# Patient Record
Sex: Male | Born: 1960 | Race: White | Hispanic: No | Marital: Married | Smoking: Current some day smoker
Health system: Southern US, Community
[De-identification: ages and names within clinical notes are randomized; demographics above are authoritative.]

## PROBLEM LIST (undated history)

## (undated) DIAGNOSIS — B182 Chronic viral hepatitis C: Secondary | ICD-10-CM

## (undated) DIAGNOSIS — M1990 Unspecified osteoarthritis, unspecified site: Secondary | ICD-10-CM

## (undated) DIAGNOSIS — K7460 Unspecified cirrhosis of liver: Secondary | ICD-10-CM

## (undated) HISTORY — PX: WRIST SURGERY: SHX841

## (undated) HISTORY — DX: Chronic viral hepatitis C: B18.2

## (undated) HISTORY — DX: Unspecified cirrhosis of liver: K74.60

## (undated) HISTORY — DX: Unspecified osteoarthritis, unspecified site: M19.90

## (undated) HISTORY — PX: FRACTURE SURGERY: SHX138

## (undated) HISTORY — PX: KNEE SURGERY: SHX244

---

## 1998-12-20 ENCOUNTER — Emergency Department (HOSPITAL_COMMUNITY): Admission: EM | Admit: 1998-12-20 | Discharge: 1998-12-20 | Payer: Self-pay

## 2005-01-03 ENCOUNTER — Ambulatory Visit: Payer: Self-pay | Admitting: Family Medicine

## 2012-03-03 ENCOUNTER — Emergency Department: Payer: Self-pay | Admitting: Emergency Medicine

## 2012-03-03 LAB — URINALYSIS, COMPLETE
Bacteria: NONE SEEN
Bilirubin,UR: NEGATIVE
Glucose,UR: NEGATIVE mg/dL (ref 0–75)
Ketone: NEGATIVE
Leukocyte Esterase: NEGATIVE
Nitrite: NEGATIVE
Ph: 6 (ref 4.5–8.0)
Protein: NEGATIVE
RBC,UR: <1 /HPF (ref 0–5)
Specific Gravity: 1.002 (ref 1.003–1.030)
Squamous Epithelial: NONE SEEN
WBC UR: 1 /HPF (ref 0–5)

## 2012-03-03 LAB — CBC WITH DIFFERENTIAL/PLATELET
Bands: 15 %
Eosinophil: 2 %
HCT: 45.4 % (ref 40.0–52.0)
HGB: 16.2 g/dL (ref 13.0–18.0)
Lymphocytes: 21 %
MCH: 35.7 pg — ABNORMAL HIGH (ref 26.0–34.0)
MCHC: 35.7 g/dL (ref 32.0–36.0)
MCV: 100 fL (ref 80–100)
Metamyelocyte: 2 %
Monocytes: 5 %
Other Cells Blood: 3
Platelet: 35 10*3/uL — ABNORMAL LOW (ref 150–440)
RBC: 4.53 10*6/uL (ref 4.40–5.90)
RDW: 11.9 % (ref 11.5–14.5)
Segmented Neutrophils: 34 %
Variant Lymphocyte - H1-Rlymph: 18 %
WBC: 5.5 10*3/uL (ref 3.8–10.6)

## 2012-03-03 LAB — BASIC METABOLIC PANEL
Anion Gap: 6 — ABNORMAL LOW (ref 7–16)
BUN: 11 mg/dL (ref 7–18)
Calcium, Total: 8.6 mg/dL (ref 8.5–10.1)
Chloride: 94 mmol/L — ABNORMAL LOW (ref 98–107)
Co2: 30 mmol/L (ref 21–32)
Creatinine: 0.91 mg/dL (ref 0.60–1.30)
EGFR (African American): >60
EGFR (Non-African Amer.): >60
Glucose: 113 mg/dL — ABNORMAL HIGH (ref 65–99)
Osmolality: 261 (ref 275–301)
Potassium: 3.4 mmol/L — ABNORMAL LOW (ref 3.5–5.1)
Sodium: 130 mmol/L — ABNORMAL LOW (ref 136–145)

## 2016-01-01 ENCOUNTER — Ambulatory Visit: Payer: PRIVATE HEALTH INSURANCE | Admitting: Orthopaedic Surgery

## 2016-01-22 ENCOUNTER — Ambulatory Visit (INDEPENDENT_AMBULATORY_CARE_PROVIDER_SITE_OTHER): Payer: PRIVATE HEALTH INSURANCE

## 2016-01-22 ENCOUNTER — Ambulatory Visit (INDEPENDENT_AMBULATORY_CARE_PROVIDER_SITE_OTHER): Payer: PRIVATE HEALTH INSURANCE | Admitting: Orthopaedic Surgery

## 2016-01-22 ENCOUNTER — Encounter: Payer: Self-pay | Admitting: Orthopaedic Surgery

## 2016-01-22 VITALS — BP 110/77 | HR 71 | Temp 96.8°F | Ht 74.0 in | Wt 234.5 lb

## 2016-01-22 DIAGNOSIS — M25562 Pain in left knee: Secondary | ICD-10-CM

## 2016-01-22 MED ORDER — NAPROXEN 500 MG PO TABS: 500.0000 mg | ORAL_TABLET | Freq: Two times a day (BID) | ORAL | Status: DC

## 2016-01-22 MED ORDER — HYDROCODONE-ACETAMINOPHEN 5-325 MG PO TABS: 1.0000 | ORAL_TABLET | ORAL | Status: DC | PRN

## 2016-01-22 NOTE — Progress Notes (Signed)
Subjective:  My left knee hurts    Patient ID: Alexander Wilkins, male    DOB: [birth year], 55 y.o.   MRN: [id number]  HPI He has about a two month to six week history of left knee pain.  It got worse, it had swelling and he had slight giving way.  Then it got better so he cancelled an appointment here.  But over the last two weeks it has gotten more tender and more swelling.  He stepped wrong off a back of a truck about a month ago which made it worse.  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  He is active.  The knee is causing him to slow down and is painful.  He is concerned as he had surgery on the right knee in 1974 for medial meniscus tear.  He is concerned his knee is no better.  He has tried Advil, Aspercreme, rest, ice, heat with only slight help.  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   Review of Systems  HENT: Negative for congestion.   Respiratory: Negative for cough and shortness of breath.   Cardiovascular: Negative for chest pain and leg swelling.  Endocrine: Negative for cold intolerance.  Musculoskeletal: Positive for joint swelling, arthralgias and gait problem.  Allergic/Immunologic: Negative for environmental allergies.   History reviewed. No pertinent past medical history.  Past Surgical History  Procedure Laterality Date  . Knee surgery Right   . Wrist surgery Left     No current outpatient prescriptions on file prior to visit.   No current facility-administered medications on file prior to visit.    Social History   Social History  . Marital Status: Divorced    Spouse Name: N/A  . Number of Children: N/A  . Years of Education: N/A   Occupational History  . Not on file.   Social History Main Topics  . Smoking status: Former Games developermoker  . Smokeless tobacco: Never Used  . Alcohol Use: Yes     Comment: occ  . Drug Use: No  . Sexual Activity: Not on file   Other Topics Concern  . Not on file   Social History Narrative   . No narrative on file    BP 110/77 mmHg  Pulse 71  Temp(Src) 96.8 F (36 C)  Ht 6\' 2"  (1.88 m)  Wt 234 lb 8 oz (106.369 kg)  BMI 30.10 kg/m2     Objective:   Physical Exam  Constitutional: He is oriented to person, place, and time. He appears well-developed and well-nourished.  HENT:  Head: Normocephalic and atraumatic.  Eyes: Conjunctivae and EOM are normal. Pupils are equal, round, and reactive to light.  Neck: Normal range of motion. Neck supple.  Cardiovascular: Normal rate, regular rhythm and intact distal pulses.   Pulmonary/Chest: Effort normal.  Abdominal: Soft.  Musculoskeletal: He exhibits tenderness (Left knee has effusion 1+, medial joint line pain, weakly positive McMurray medially, ROM 0 to 110, slight limp to the left, right knee has long medial scar, full motion.  NV intact.).  Neurological: He is alert and oriented to person, place, and time. He has normal reflexes. No cranial nerve deficit. He exhibits normal muscle tone. Coordination normal.  Skin: Skin is warm and dry.  Psychiatric: He has a normal mood and affect. His behavior is normal. Judgment and thought content normal.   X-rays of the left knee were done and reported separately.  Assessment & Plan:   Encounter Diagnosis  Name Primary?  . Left knee pain Yes   I feel he has a medial meniscus tear by history and exam.  He needs to wait six weeks before insurance will cover MRI.  I have given Rx for Naprosyn and pain medicine.    Precautions discussed.  Call if any problem.  Return in one month.  Electronically Signed Darreld McleanWayne Oluwatosin Bracy, MD 6/27/20174:34 PM

## 2016-02-19 ENCOUNTER — Encounter: Payer: Self-pay | Admitting: Orthopaedic Surgery

## 2016-02-19 ENCOUNTER — Ambulatory Visit: Payer: PRIVATE HEALTH INSURANCE | Admitting: Orthopaedic Surgery

## 2016-02-20 ENCOUNTER — Encounter: Payer: Self-pay | Admitting: Orthopaedic Surgery

## 2016-02-20 ENCOUNTER — Ambulatory Visit (INDEPENDENT_AMBULATORY_CARE_PROVIDER_SITE_OTHER): Payer: No Typology Code available for payment source | Admitting: Orthopaedic Surgery

## 2016-02-20 VITALS — BP 121/79 | HR 71 | Temp 97.5°F | Ht 74.0 in | Wt 236.0 lb

## 2016-02-20 DIAGNOSIS — M25562 Pain in left knee: Secondary | ICD-10-CM | POA: Diagnosis not present

## 2016-02-20 MED ORDER — DOXYCYCLINE HYCLATE 50 MG PO CAPS: ORAL_CAPSULE | ORAL | 0 refills | Status: DC

## 2016-02-20 NOTE — Patient Instructions (Signed)
Set up MRI of the left knee.

## 2016-02-20 NOTE — Progress Notes (Signed)
Patient Alexander Wilkins, male DOB:March 18, 1961, 55 y.o. WUJ:[id number]  Chief Complaint  Patient presents with  . Follow-up    Left knee    HPI  Alexander Wilkins is a 55 y.o. male who has continued left knee pain. He has swelling, popping and giving way of the knee.  He is unable to sail on his sail boat secondary to the knee instability and pain.  He has no new trauma.  He has done the exercises given and taken the medicine. HPI  Body mass index is 30.3 kg/m.  ROS  Review of Systems  HENT: Negative for congestion.   Respiratory: Negative for cough and shortness of breath.   Cardiovascular: Negative for chest pain and leg swelling.  Endocrine: Negative for cold intolerance.  Musculoskeletal: Positive for arthralgias, gait problem and joint swelling.  Allergic/Immunologic: Negative for environmental allergies.    History reviewed. No pertinent past medical history.  Past Surgical History:  Procedure Laterality Date  . KNEE SURGERY Right   . WRIST SURGERY Left     History reviewed. No pertinent family history.  Social History Social History  Substance Use Topics  . Smoking status: Former Games developer  . Smokeless tobacco: Never Used  . Alcohol use Yes     Comment: occ    No Known Allergies  Current Outpatient Prescriptions  Medication Sig Dispense Refill  . HYDROcodone-acetaminophen (NORCO/VICODIN) 5-325 MG tablet Take 1 tablet by mouth every 4 (four) hours as needed for moderate pain (Must last 15 days.Do not take and drive a car or use machinery.). 60 tablet 0  . naproxen (NAPROSYN) 500 MG tablet Take 1 tablet (500 mg total) by mouth 2 (two) times daily with a meal. 60 tablet 5  . doxycycline (VIBRAMYCIN) 50 MG capsule Two tablets by mouth twice a day 40 capsule 0   No current facility-administered medications for this visit.      Physical Exam  Blood pressure 121/79, pulse 71, temperature 97.5 F (36.4 C), height  (1.88 m), weight 236 lb (107  kg).  Constitutional: overall normal hygiene, normal nutrition, well developed, normal grooming, normal body habitus. Assistive device:none  Musculoskeletal: gait and station Limp left, muscle tone and strength are normal, no tremors or atrophy is present.  .  Neurological: coordination overall normal.  Deep tendon reflex/nerve stretch intact.  Sensation normal.  Cranial nerves II-XII intact.   Skin:   normal overall no scars, lesions, ulcers or rashes. No psoriasis.  Psychiatric: Alert and oriented x 3.  Recent memory intact, remote memory unclear.  Normal mood and affect. Well groomed.  Good eye contact.  Cardiovascular: overall no swelling, no varicosities, no edema bilaterally, normal temperatures of the legs and arms, no clubbing, cyanosis and good capillary refill.  Lymphatic: palpation is normal.  The left lower extremity is examined:  Inspection:  Thigh:  Non-tender and no defects  Knee has swelling 1+ effusion.                        Joint tenderness is present                        Patient is tender over the medial joint line  Lower Leg:  Has normal appearance and no tenderness or defects  Ankle:  Non-tender and no defects  Foot:  Non-tender and no defects Range of Motion:  Knee:  Range of motion is: 0-110  Crepitus is  present  Ankle:  Range of motion is normal. Strength and Tone:  The left lower extremity has normal strength and tone. Stability:  Knee:  The knee has positive medial McMurray.  Ankle:  The ankle is stable.    The patient has been educated about the nature of the problem(s) and counseled on treatment options.  The patient appeared to understand what I have discussed and is in agreement with it.  Encounter Diagnosis  Name Primary?  . Left knee pain Yes    PLAN Call if any problems.  Precautions discussed.  Continue current medications.   Return to clinic after MRI of the left knee.   I am concerned about a medial  meniscus tear of the left knee. He has pain, swelling, and giving way.  He has not improved with rest, NSAIDs.  He has positive medial McMurray.  I await the MRI. I have shown patient a model of the knee and explained what I think he has going on in the knee.  Electronically Signed Darreld Mclean, MD 7/26/201710:53 AM

## 2016-02-26 ENCOUNTER — Ambulatory Visit: Payer: PRIVATE HEALTH INSURANCE | Admitting: Orthopaedic Surgery

## 2016-03-05 ENCOUNTER — Ambulatory Visit (HOSPITAL_COMMUNITY)
Admission: RE | Admit: 2016-03-05 | Discharge: 2016-03-05 | Disposition: A | Discharge status: Home / Self Care | Payer: No Typology Code available for payment source | Source: Ambulatory Visit | Attending: Orthopaedic Surgery | Admitting: Orthopaedic Surgery

## 2016-03-05 DIAGNOSIS — M25562 Pain in left knee: Secondary | ICD-10-CM | POA: Insufficient documentation

## 2016-03-05 DIAGNOSIS — M7122 Synovial cyst of popliteal space [Baker], left knee: Secondary | ICD-10-CM | POA: Insufficient documentation

## 2016-03-05 DIAGNOSIS — M11262 Other chondrocalcinosis, left knee: Secondary | ICD-10-CM | POA: Diagnosis not present

## 2016-03-05 DIAGNOSIS — M1712 Unilateral primary osteoarthritis, left knee: Secondary | ICD-10-CM | POA: Insufficient documentation

## 2016-03-05 DIAGNOSIS — X58XXXA Exposure to other specified factors, initial encounter: Secondary | ICD-10-CM | POA: Diagnosis not present

## 2016-03-05 DIAGNOSIS — S83242A Other tear of medial meniscus, current injury, left knee, initial encounter: Secondary | ICD-10-CM | POA: Insufficient documentation

## 2016-03-06 ENCOUNTER — Ambulatory Visit (INDEPENDENT_AMBULATORY_CARE_PROVIDER_SITE_OTHER): Payer: PRIVATE HEALTH INSURANCE | Admitting: Orthopaedic Surgery

## 2016-03-06 ENCOUNTER — Encounter: Payer: Self-pay | Admitting: Orthopaedic Surgery

## 2016-03-06 VITALS — BP 111/74 | HR 74 | Temp 97.9°F | Ht 74.0 in | Wt 234.6 lb

## 2016-03-06 DIAGNOSIS — M25562 Pain in left knee: Secondary | ICD-10-CM

## 2016-03-06 MED ORDER — HYDROCODONE-ACETAMINOPHEN 7.5-325 MG PO TABS: 1.0000 | ORAL_TABLET | ORAL | 0 refills | Status: DC | PRN

## 2016-03-06 NOTE — Progress Notes (Signed)
Patient XL:[id number] Alexander Wilkins, male DOB:1960-10-17, 55 y.o. VOZ:[id number]  Chief Complaint  Patient presents with  . Results    MRI LEFT KNEE    HPI  Alexander Wilkins is a 55 y.o. male who has had left knee pain that was getting worse with giving way, swelling and pain. MRI was done and it shows: IMPRESSION: 1. 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 Associated with meniscal chondrocalcinosis, this suggests CPPD arthropathy. Central loose bodies anteriorly in the joint, possibly imbedded in Hoffa's fat. 2. Mild degenerative free edge tearing of the posterior horn of the medial meniscus. The lateral meniscus appears intact. 3. The cruciate and collateral ligaments are intact. 4. Moderate-sized Baker's cyst.  I went over the results with him.  I told him over time he would be a candidate for a total knee.  I would recommend vicosupplementation for now. I have given Rx for Synvisc for him.  He will contact his insurance and see if it will cover it.  He will then call the office and set up three weekly appointments. HPI  Body mass index is 30.12 kg/m.  ROS  Review of Systems  HENT: Negative for congestion.   Respiratory: Negative for cough and shortness of breath.   Cardiovascular: Negative for chest pain and leg swelling.  Endocrine: Negative for cold intolerance.  Musculoskeletal: Positive for arthralgias, gait problem and joint swelling.  Allergic/Immunologic: Negative for environmental allergies.    History reviewed. No pertinent past medical history.  Past Surgical History:  Procedure Laterality Date  . KNEE SURGERY Right   . WRIST SURGERY Left     History reviewed. No pertinent family history.  Social History Social History  Substance Use Topics  . Smoking status: Former Games developer  . Smokeless tobacco: Never Used  . Alcohol use Yes     Comment: occ    No Known Allergies  Current Outpatient  Prescriptions  Medication Sig Dispense Refill  . doxycycline (VIBRAMYCIN) 50 MG capsule Two tablets by mouth twice a day 40 capsule 0  . naproxen (NAPROSYN) 500 MG tablet Take 1 tablet (500 mg total) by mouth 2 (two) times daily with a meal. 60 tablet 5  . HYDROcodone-acetaminophen (NORCO) 7.5-325 MG tablet Take 1 tablet by mouth every 4 (four) hours as needed for moderate pain (Must last 30 days.  Do not drive or operate machinery while taking this medicine.). 120 tablet 0   No current facility-administered medications for this visit.      Physical Exam  Blood pressure 111/74, pulse 74, temperature 97.9 F (36.6 C), height  (1.88 m), weight 234 lb 9.6 oz (106.4 kg).  Constitutional: overall normal hygiene, normal nutrition, well developed, normal grooming, normal body habitus. Assistive device:none  Musculoskeletal: gait and station Limp left, muscle tone and strength are normal, no tremors or atrophy is present.  .  Neurological: coordination overall normal.  Deep tendon reflex/nerve stretch intact.  Sensation normal.  Cranial nerves II-XII intact.   Skin:   normal overall no scars, lesions, ulcers or rashes. No psoriasis.  Psychiatric: Alert and oriented x 3.  Recent memory intact, remote memory unclear.  Normal mood and affect. Well groomed.  Good eye contact.  Cardiovascular: overall no swelling, no varicosities, no edema bilaterally, normal temperatures of the legs and arms, no clubbing, cyanosis and good capillary refill.  Lymphatic: palpation is normal.  The left lower extremity is examined:  Inspection:  Thigh:  Non-tender and no defects  Knee has  swelling 1+ effusion.                        Joint tenderness is present                        Patient is tender over the medial joint line  Lower Leg:  Has normal appearance and no tenderness or defects  Ankle:  Non-tender and no defects  Foot:  Non-tender and no defects Range of Motion:  Knee:  Range of motion is:  0-105                        Crepitus is  present  Ankle:  Range of motion is normal. Strength and Tone:  The left lower extremity has normal strength and tone. Stability:  Knee:  The knee is stable.  Ankle:  The ankle is stable.    The patient has been educated about the nature of the problem(s) and counseled on treatment options.  The patient appeared to understand what I have discussed and is in agreement with it.  Encounter Diagnosis  Name Primary?  . Left knee pain Yes    PLAN Call if any problems.  Precautions discussed.  Continue current medications.   Return to clinic to get Synvisc and then make appointments.   Electronically Signed Darreld McleanWayne Eldo Umanzor, MD 8/10/20174:10 PM

## 2016-03-10 ENCOUNTER — Telehealth: Payer: Self-pay | Admitting: Orthopaedic Surgery

## 2016-03-11 MED ORDER — DOXYCYCLINE HYCLATE 50 MG PO CAPS: ORAL_CAPSULE | ORAL | 0 refills | Status: DC

## 2016-09-03 ENCOUNTER — Ambulatory Visit (INDEPENDENT_AMBULATORY_CARE_PROVIDER_SITE_OTHER): Payer: Self-pay | Admitting: Orthopaedic Surgery

## 2016-09-03 ENCOUNTER — Encounter: Payer: Self-pay | Admitting: Orthopaedic Surgery

## 2016-09-03 VITALS — BP 131/72 | HR 73 | Temp 97.7°F | Ht 74.0 in | Wt 236.0 lb

## 2016-09-03 DIAGNOSIS — G8929 Other chronic pain: Secondary | ICD-10-CM

## 2016-09-03 DIAGNOSIS — M25562 Pain in left knee: Secondary | ICD-10-CM

## 2016-09-03 MED ORDER — HYDROCODONE-ACETAMINOPHEN 7.5-325 MG PO TABS: 1.0000 | ORAL_TABLET | ORAL | 0 refills | Status: DC | PRN

## 2016-09-03 NOTE — Progress Notes (Signed)
Patient Alexander Wilkins, male DOB:Aug 27, 1960, 56 y.o. [id number]:[id number]  Chief Complaint  Patient presents with  . Follow-up    patient wants to discuss medication    HPI  Alexander Wilkins is a 56 y.o. male who has had pain in the left knee chronically.  I last saw him in August 2017.  We discussed possible Synvisc injections.  He has decided he does not want that.  He takes his Naprosyn.  He has good and bad days.  He has no new trauma.  He has swelling and popping but no giving way.   HPI  Body mass index is 30.3 kg/m.  ROS  Review of Systems  HENT: Negative for congestion.   Respiratory: Negative for cough and shortness of breath.   Cardiovascular: Negative for chest pain and leg swelling.  Endocrine: Negative for cold intolerance.  Musculoskeletal: Positive for arthralgias, gait problem and joint swelling.  Allergic/Immunologic: Negative for environmental allergies.    No past medical history on file.  Past Surgical History:  Procedure Laterality Date  . KNEE SURGERY Right   . WRIST SURGERY Left     No family history on file.  Social History Social History  Substance Use Topics  . Smoking status: Former Games developer  . Smokeless tobacco: Never Used  . Alcohol use Yes     Comment: occ    No Known Allergies  Current Outpatient Prescriptions  Medication Sig Dispense Refill  . doxycycline (VIBRAMYCIN) 50 MG capsule Two tablets by mouth twice a day 40 capsule 0  . HYDROcodone-acetaminophen (NORCO) 7.5-325 MG tablet Take 1 tablet by mouth every 4 (four) hours as needed for moderate pain (Must last 30 days.  Do not drive or operate machinery while taking this medicine.). 120 tablet 0  . naproxen (NAPROSYN) 500 MG tablet Take 1 tablet (500 mg total) by mouth 2 (two) times daily with a meal. 60 tablet 5   No current facility-administered medications for this visit.      Physical Exam  Blood pressure 131/72, pulse 73, temperature 97.7 F (36.5 C), height 6\' 2"   (1.88 m), weight 236 lb (107 kg).  Constitutional: overall normal hygiene, normal nutrition, well developed, normal grooming, normal body habitus. Assistive device:none  Musculoskeletal: gait and station Limp left, muscle tone and strength are normal, no tremors or atrophy is present.  .  Neurological: coordination overall normal.  Deep tendon reflex/nerve stretch intact.  Sensation normal.  Cranial nerves II-XII intact.   Skin:   Normal overall no scars, lesions, ulcers or rashes. No psoriasis.  Psychiatric: Alert and oriented x 3.  Recent memory intact, remote memory unclear.  Normal mood and affect. Well groomed.  Good eye contact.  Cardiovascular: overall no swelling, no varicosities, no edema bilaterally, normal temperatures of the legs and arms, no clubbing, cyanosis and good capillary refill.  Lymphatic: palpation is normal.  The left lower extremity is examined:  Inspection:  Thigh:  Non-tender and no defects  Knee has swelling 1+ effusion.                        Joint tenderness is present                        Patient is tender over the medial joint line  Lower Leg:  Has normal appearance and no tenderness or defects  Ankle:  Non-tender and no defects  Foot:  Non-tender and no defects Range of Motion:  Knee:  Range of motion is: 0-110                        Crepitus is  present  Ankle:  Range of motion is normal. Strength and Tone:  The left lower extremity has normal strength and tone. Stability:  Knee:  The knee is stable.  Ankle:  The ankle is stable.    The patient has been educated about the nature of the problem(s) and counseled on treatment options.  The patient appeared to understand what I have discussed and is in agreement with it.  Encounter Diagnosis  Name Primary?  . Chronic pain of left knee Yes    PLAN Call if any problems.  Precautions discussed.  Continue current medications.   Return to clinic PRN   [facility] [place] [facility] [place] web site prior to prescribing narcotic medicine for this patient.  Electronically Signed Alexander McleanWayne Felcia Huebert, MD 2/7/20188:57 AM

## 2019-07-24 ENCOUNTER — Encounter: Payer: Self-pay | Admitting: Emergency Medicine

## 2019-07-24 ENCOUNTER — Emergency Department
Admission: EM | Admit: 2019-07-24 | Discharge: 2019-07-24 | Disposition: A | Discharge status: Home / Self Care | Payer: Self-pay | Attending: Emergency Medicine | Admitting: Emergency Medicine

## 2019-07-24 ENCOUNTER — Other Ambulatory Visit: Payer: Self-pay

## 2019-07-24 ENCOUNTER — Emergency Department: Payer: Self-pay

## 2019-07-24 DIAGNOSIS — M25531 Pain in right wrist: Secondary | ICD-10-CM

## 2019-07-24 DIAGNOSIS — S6991XA Unspecified injury of right wrist, hand and finger(s), initial encounter: Secondary | ICD-10-CM | POA: Insufficient documentation

## 2019-07-24 DIAGNOSIS — Y929 Unspecified place or not applicable: Secondary | ICD-10-CM | POA: Insufficient documentation

## 2019-07-24 DIAGNOSIS — Y998 Other external cause status: Secondary | ICD-10-CM | POA: Insufficient documentation

## 2019-07-24 DIAGNOSIS — R739 Hyperglycemia, unspecified: Secondary | ICD-10-CM

## 2019-07-24 DIAGNOSIS — Y9389 Activity, other specified: Secondary | ICD-10-CM | POA: Insufficient documentation

## 2019-07-24 DIAGNOSIS — W228XXA Striking against or struck by other objects, initial encounter: Secondary | ICD-10-CM | POA: Insufficient documentation

## 2019-07-24 DIAGNOSIS — Z87891 Personal history of nicotine dependence: Secondary | ICD-10-CM | POA: Insufficient documentation

## 2019-07-24 LAB — BASIC METABOLIC PANEL
Anion gap: 8 (ref 5–15)
BUN: 13 mg/dL (ref 6–20)
CO2: 21 mmol/L — ABNORMAL LOW (ref 22–32)
Calcium: 8.8 mg/dL — ABNORMAL LOW (ref 8.9–10.3)
Chloride: 104 mmol/L (ref 98–111)
Creatinine, Ser: 0.7 mg/dL (ref 0.61–1.24)
GFR calc Af Amer: >60 mL/min (ref >60)
GFR calc non Af Amer: >60 mL/min (ref >60)
Glucose, Bld: 151 mg/dL — ABNORMAL HIGH (ref 70–99)
Potassium: 4 mmol/L (ref 3.5–5.1)
Sodium: 133 mmol/L — ABNORMAL LOW (ref 135–145)

## 2019-07-24 LAB — CBC
HCT: 39.6 % (ref 39.0–52.0)
Hemoglobin: 14.4 g/dL (ref 13.0–17.0)
MCH: 34.7 pg — ABNORMAL HIGH (ref 26.0–34.0)
MCHC: 36.4 g/dL — ABNORMAL HIGH (ref 30.0–36.0)
MCV: 95.4 fL (ref 80.0–100.0)
Platelets: 114 10*3/uL — ABNORMAL LOW (ref 150–400)
RBC: 4.15 MIL/uL — ABNORMAL LOW (ref 4.22–5.81)
RDW: 11.5 % (ref 11.5–15.5)
WBC: 7.2 10*3/uL (ref 4.0–10.5)
nRBC: 0 % (ref 0.0–0.2)

## 2019-07-24 LAB — URIC ACID: Uric Acid, Serum: 6.2 mg/dL (ref 3.7–8.6)

## 2019-07-24 MED ORDER — IBUPROFEN 800 MG PO TABS: 800.0000 mg | ORAL_TABLET | Freq: Once | ORAL | Status: DC

## 2019-07-24 MED ORDER — OXYCODONE-ACETAMINOPHEN 5-325 MG PO TABS: 1.0000 | ORAL_TABLET | Freq: Three times a day (TID) | ORAL | 0 refills | Status: AC | PRN

## 2019-07-24 MED ORDER — OXYCODONE-ACETAMINOPHEN 5-325 MG PO TABS
1.0000 | ORAL_TABLET | Freq: Once | ORAL | Status: DC
  Filled 2019-07-24: qty 1

## 2019-07-24 MED ORDER — SODIUM CHLORIDE 0.9 % IV BOLUS
500.0000 mL | Freq: Once | INTRAVENOUS | Status: AC
  Administered 2019-07-24: 500 mL via INTRAVENOUS

## 2019-07-24 MED ORDER — KETOROLAC TROMETHAMINE 30 MG/ML IJ SOLN
30.0000 mg | Freq: Once | INTRAMUSCULAR | Status: AC
  Administered 2019-07-24: 30 mg via INTRAVENOUS
  Filled 2019-07-24: qty 1

## 2019-07-24 MED ORDER — IBUPROFEN 600 MG PO TABS: 600.0000 mg | ORAL_TABLET | Freq: Four times a day (QID) | ORAL | 0 refills | Status: DC | PRN

## 2019-07-24 NOTE — ED Provider Notes (Signed)
[facility] Emergency Department Provider Note  ____________________________________________  Time seen: Approximately 1:50 PM  I have reviewed the triage vital signs and the nursing notes.   HISTORY  Chief Complaint No chief complaint on file.    HPI Alexander Wilkins is a 58 y.o. male that presents to emergency department for evaluation of right wrist pain for 2 days.  Patient states that wrist does look swollen near his thumb.  Patient states that pain started after he hit his wrist possibly on the oven.  Patient is also concerned that maybe he slept with his wrist wrong over top of his head.  He thought maybe he had carpal tunnel syndrome so he read some exercises to do for carpal tunnel and has been trying them without any relief.  Patient states that his temperature usually runs about 95 degrees and has noticed that it has been running about 97 degrees recently.  No history of gout.  Patient does use marijuana but does not use any IV drugs.  No wounds.   History reviewed. No pertinent past medical history.  There are no problems to display for this patient.   Past Surgical History:  Procedure Laterality Date  . KNEE SURGERY Right   . WRIST SURGERY Left     Prior to Admission medications   Medication Sig Start Date End Date Taking? Authorizing Provider  doxycycline (VIBRAMYCIN) 50 MG capsule Two tablets by mouth twice a day 03/11/16   Sanjuana Kava, MD  HYDROcodone-acetaminophen [facility]) 7.5-325 MG tablet Take 1 tablet by mouth every 4 (four) hours as needed for moderate pain (Must last 30 days.  Do not drive or operate machinery while taking this medicine.). 09/03/16   Sanjuana Kava, MD  ibuprofen (ADVIL) 600 MG tablet Take 1 tablet (600 mg total) by mouth every 6 (six) hours as needed. 07/24/19   Laban Emperor, PA-C  naproxen (NAPROSYN) 500 MG tablet Take 1 tablet (500 mg total) by mouth 2 (two) times daily with a meal. 01/22/16   Sanjuana Kava, MD   oxyCODONE-acetaminophen (PERCOCET) 5-325 MG tablet Take 1 tablet by mouth every 8 (eight) hours as needed for up to 3 days. 07/24/19 07/27/19  Laban Emperor, PA-C    Allergies Patient has no known allergies.  No family history on file.  Social History Social History   Tobacco Use  . Smoking status: Former Research scientist (life sciences)  . Smokeless tobacco: Never Used  Substance Use Topics  . Alcohol use: Yes    Comment: occ  . Drug use: No     Review of Systems  Constitutional: No chills Cardiovascular: No chest pain. Respiratory: No SOB. Gastrointestinal: No nausea, no vomiting.  Musculoskeletal: Positive for wrist pain. Skin: Negative for rash, abrasions, lacerations, ecchymosis. Neurological: Negative for numbness or tingling   ____________________________________________   PHYSICAL EXAM:  VITAL SIGNS: ED Triage Vitals  Enc Vitals Group     BP 07/24/19 1323 125/79     Pulse Rate 07/24/19 1323 77     Resp 07/24/19 1323 16     Temp 07/24/19 1323 97.7 F (36.5 C)     Temp Source 07/24/19 1323 Oral     SpO2 07/24/19 1323 100 %     Weight 07/24/19 1319 235 lb 14.3 oz (107 kg)     Height --      Head Circumference --      Peak Flow --      Pain Score 07/24/19 1319 9     Pain Loc --  Pain Edu? --      Excl. in GC? --      Constitutional: Alert and oriented. Well appearing and in no acute distress. Eyes: Conjunctivae are normal. PERRL. EOMI. Head: Atraumatic. ENT:      Ears:      Nose: No congestion/rhinnorhea.      Mouth/Throat: Mucous membranes are moist.  Neck: No stridor.   Cardiovascular: Normal rate, regular rhythm.  Good peripheral circulation.  Cap refill less than 3 seconds. Respiratory: Normal respiratory effort without tachypnea or retractions. Lungs CTAB. Good air entry to the bases with no decreased or absent breath sounds. Musculoskeletal: Full range of motion to all extremities. No gross deformities appreciated.  Limited range of motion of right wrist  secondary to pain.  Mild swelling to dorsal right wrist.  Warm to touch. No overlying erythema or ecchymosis.  Full range of motion of all 5 digits. Neurologic:  Normal speech and language. No gross focal neurologic deficits are appreciated.  Skin:  Skin is warm, dry and intact. No rash noted. Psychiatric: Mood and affect are normal. Speech and behavior are normal. Patient exhibits appropriate insight and judgement.   ____________________________________________   LABS (all labs ordered are listed, but only abnormal results are displayed)  Labs Reviewed  CBC - Abnormal; Notable for the following components:      Result Value   RBC 4.15 (*)    MCH 34.7 (*)    MCHC 36.4 (*)    Platelets 114 (*)    All other components within normal limits  BASIC METABOLIC PANEL - Abnormal; Notable for the following components:   Sodium 133 (*)    CO2 21 (*)    Glucose, Bld 151 (*)    Calcium 8.8 (*)    All other components within normal limits  URIC ACID   ____________________________________________  EKG   ____________________________________________  RADIOLOGY Lexine BatonI, Lonzie Simmer, personally viewed and evaluated these images (plain radiographs) as part of my medical decision making, as well as reviewing the written report by the radiologist.  DG Wrist Complete Right  Result Date: 07/24/2019 CLINICAL DATA:  Status post fall with right wrist pain. EXAM: RIGHT WRIST - COMPLETE 3+ VIEW COMPARISON:  None. FINDINGS: There is no evidence of fracture or dislocation. Degenerative joint changes of the wrist are noted. Soft tissues are unremarkable. IMPRESSION: No acute fracture or dislocation identified. Electronically Signed   By: Sherian ReinWei-Chen  Lin M.D.   On: 07/24/2019 14:31    ____________________________________________    PROCEDURES  Procedure(s) performed:    Procedures    Medications  oxyCODONE-acetaminophen (PERCOCET/ROXICET) 5-325 MG per tablet 1 tablet (has no administration in time  range)  ketorolac (TORADOL) 30 MG/ML injection 30 mg (30 mg Intravenous Given 07/24/19 1428)  sodium chloride 0.9 % bolus 500 mL (500 mLs Intravenous New Bag/Given 07/24/19 1539)     ____________________________________________   INITIAL IMPRESSION / ASSESSMENT AND PLAN / ED COURSE  Pertinent labs & imaging results that were available during my care of the patient were reviewed by me and considered in my medical decision making (see chart for details).  Review of the [place] CSRS was performed in accordance of the [facility] prior to dispensing any controlled drugs.     Patient presents to emergency department for evaluation of right wrist pain for 2 days.  Vital signs and exam are reassuring.  No acute bony abnormality on x-ray.  Pain likely due to wrist injury after hitting his wrist on the stove.  Patient has limited  range of motion due to pain and is mildly swollen.  Uric acid within normal limits.  No increased white blood cell count or risk factors for infection.  Patient was given fluids for Na 133, glucose 151.  Patient did have a Gatorade today but has not had much to eat today.  Patient was encouraged to follow-up with primary care for blood sugar recheck.   Patient was given IV Toradol for inflammation.  Patient will be discharged home with prescriptions for ibuprofen and a short course of Percocet. Patient is to follow up with primary care or orthopedics as directed. Patient is given ED precautions to return to the ED for any worsening or new symptoms.   MADSEN RIDDLE was evaluated in Emergency Department on 07/24/2019 for the symptoms described in the history of present illness. He was evaluated in the context of the global COVID-19 pandemic, which necessitated consideration that the patient might be at risk for infection with the SARS-CoV-2 virus that causes COVID-19. Institutional protocols and algorithms that pertain to the evaluation of patients at risk for COVID-19 are in a state of  rapid change based on information released by regulatory bodies including the CDC and federal and state organizations. These policies and algorithms were followed during the patient's care in the ED.  ____________________________________________  FINAL CLINICAL IMPRESSION(S) / ED DIAGNOSES  Final diagnoses:  Right wrist pain  Elevated blood sugar level  Injury of right wrist, initial encounter      NEW MEDICATIONS STARTED DURING THIS VISIT:  ED Discharge Orders         Ordered    ibuprofen (ADVIL) 600 MG tablet  Every 6 hours PRN     07/24/19 1547    oxyCODONE-acetaminophen (PERCOCET) 5-325 MG tablet  Every 8 hours PRN     07/24/19 1547              This chart was dictated using voice recognition software/Dragon. Despite best efforts to proofread, errors can occur which can change the meaning. Any change was purely unintentional.    Enid Derry, PA-C 07/25/19 1509    Shaune Pollack, MD 07/26/19 1038

## 2019-07-24 NOTE — ED Triage Notes (Signed)
Wrist tightness and pain today.  Brisk capillary refill.  NAD

## 2019-07-24 NOTE — Discharge Instructions (Addendum)
Your blood sugar was mildly elevated in the emergency department. Please call primary care for recheck.  There is no fracture on your x-ray.  Please wear your wrist splint.  You can take ibuprofen for pain and inflammation.  You can take Percocet for extreme pain.  I have also given you a referral for orthopedics.

## 2020-08-28 ENCOUNTER — Other Ambulatory Visit: Payer: Self-pay

## 2020-08-28 ENCOUNTER — Emergency Department
Admission: EM | Admit: 2020-08-28 | Discharge: 2020-08-28 | Disposition: A | Discharge status: Home / Self Care | Payer: 59 | Attending: Emergency Medicine | Admitting: Emergency Medicine

## 2020-08-28 ENCOUNTER — Emergency Department: Payer: 59

## 2020-08-28 ENCOUNTER — Encounter: Payer: Self-pay | Admitting: Emergency Medicine

## 2020-08-28 DIAGNOSIS — W010XXA Fall on same level from slipping, tripping and stumbling without subsequent striking against object, initial encounter: Secondary | ICD-10-CM | POA: Insufficient documentation

## 2020-08-28 DIAGNOSIS — S52031A Displaced fracture of olecranon process with intraarticular extension of right ulna, initial encounter for closed fracture: Secondary | ICD-10-CM | POA: Diagnosis not present

## 2020-08-28 DIAGNOSIS — T148XXA Other injury of unspecified body region, initial encounter: Secondary | ICD-10-CM

## 2020-08-28 DIAGNOSIS — Z87891 Personal history of nicotine dependence: Secondary | ICD-10-CM | POA: Insufficient documentation

## 2020-08-28 DIAGNOSIS — S59901A Unspecified injury of right elbow, initial encounter: Secondary | ICD-10-CM | POA: Diagnosis present

## 2020-08-28 DIAGNOSIS — S52021A Displaced fracture of olecranon process without intraarticular extension of right ulna, initial encounter for closed fracture: Secondary | ICD-10-CM

## 2020-08-28 MED ORDER — OXYCODONE-ACETAMINOPHEN 5-325 MG PO TABS
1.0000 | ORAL_TABLET | Freq: Once | ORAL | Status: AC
  Administered 2020-08-28: 1 via ORAL
  Filled 2020-08-28: qty 1

## 2020-08-28 MED ORDER — OXYCODONE-ACETAMINOPHEN 7.5-325 MG PO TABS: 1.0000 | ORAL_TABLET | Freq: Four times a day (QID) | ORAL | 0 refills | Status: DC | PRN

## 2020-08-28 NOTE — ED Triage Notes (Signed)
First Nurse Note:  Arrives with C/O injuring right elbow on Saturday night.  States his arm is broken. Right arm is splinted and in an arm sling. States he was seen at [facility] ([place] and that he needs surgery today.

## 2020-08-28 NOTE — Discharge Instructions (Addendum)
Wear splint and sling until evaluation by Dr.  Jones Skene advised and addressed effects of medications.  Call Dr. Rosita Kea office around 8:30 in the morning and tell them you are a follow-up from the emergency room.  Your surgery is tentatively scheduled for 30 August 2020.

## 2020-08-28 NOTE — ED Notes (Signed)
See triage note  Presents with injury to right elbow   S/p fall down steps couple of days ago

## 2020-08-28 NOTE — ED Provider Notes (Signed)
[facility] Emergency Department Provider Note   ____________________________________________   Event Date/Time   First MD Initiated Contact with Patient 08/28/20 1231     (approximate)  I have reviewed the triage vital signs and the nursing notes.   HISTORY  Chief Complaint Arm Injury    HPI Alexander Wilkins is a 60 y.o. male patient presents in a sling and sling of the right upper extremity.  Patient state he tripped and fell 3 days ago and was seen at [facility] emergency room.  Patient state he was diagnosed with olecranon fracture.  Patient returned to home station for consult orthopedics.  Patient denies loss of sensation.  Patient rates his pain a 7/10.  Described pain as "aching".  Patient is right-hand dominant.         History reviewed. No pertinent past medical history.  There are no problems to display for this patient.   Past Surgical History:  Procedure Laterality Date  . KNEE SURGERY Right   . WRIST SURGERY Left     Prior to Admission medications   Medication Sig Start Date End Date Taking? Authorizing Provider  oxyCODONE-acetaminophen (PERCOCET) 7.5-325 MG tablet Take 1 tablet by mouth every 6 (six) hours as needed. 08/28/20  Yes Joni Reining, PA-C  doxycycline (VIBRAMYCIN) 50 MG capsule Two tablets by mouth twice a day 03/11/16   Darreld Mclean, MD  ibuprofen (ADVIL) 600 MG tablet Take 1 tablet (600 mg total) by mouth every 6 (six) hours as needed. 07/24/19   Enid Derry, PA-C    Allergies Patient has no known allergies.  No family history on file.  Social History Social History   Tobacco Use  . Smoking status: Former Games developer  . Smokeless tobacco: Never Used  Substance Use Topics  . Alcohol use: Yes    Comment: occ  . Drug use: No    Review of Systems Constitutional: No fever/chills Eyes: No visual changes. ENT: No sore throat. Cardiovascular: Denies chest pain. Respiratory: Denies shortness of  breath. Gastrointestinal: No abdominal pain.  No nausea, no vomiting.  No diarrhea.  No constipation. Genitourinary: Negative for dysuria. Musculoskeletal: Right elbow pain. Skin: Negative for rash. Neurological: Negative for headaches, focal weakness or numbness.   ____________________________________________   PHYSICAL EXAM:  VITAL SIGNS: ED Triage Vitals  Enc Vitals Group     BP 08/28/20 1236 (!) 142/92     Pulse Rate 08/28/20 1236 90     Resp 08/28/20 1236 17     Temp 08/28/20 1236 98.7 F (37.1 C)     Temp Source 08/28/20 1236 Oral     SpO2 08/28/20 1236 98 %     Weight 08/28/20 1223 235 lb 14.3 oz (107 kg)     Height 08/28/20 1223 6\' 2"  (1.88 m)     Head Circumference --      Peak Flow --      Pain Score 08/28/20 1222 7     Pain Loc --      Pain Edu? --      Excl. in GC? --    Constitutional: Alert and oriented. Well appearing and in no acute distress. Eyes: Conjunctivae are normal. PERRL. EOMI. Head: Atraumatic. Nose: No congestion/rhinnorhea. Mouth/Throat: Mucous membranes are moist.  Oropharynx non-erythematous. Neck: No stridor.   Cardiovascular: Normal rate, regular rhythm. Grossly normal heart sounds.  Good peripheral circulation. Respiratory: Normal respiratory effort.  No retractions. Lungs CTAB. Gastrointestinal: Soft and nontender. No distention. No abdominal bruits. No CVA tenderness.  Genitourinary: Deferred Musculoskeletal: Patient right upper extremity is in a splint and sling.. Neurologic:  Normal speech and language. No gross focal neurologic deficits are appreciated. No gait instability. Skin:  Skin is warm, dry and intact. No rash noted. Psychiatric: Mood and affect are normal. Speech and behavior are normal.  ____________________________________________   LABS (all labs ordered are listed, but only abnormal results are displayed)  Labs Reviewed - No data to  display ____________________________________________  EKG   ____________________________________________  RADIOLOGY I, Joni Reining, personally viewed and evaluated these images (plain radiographs) as part of my medical decision making, as well as reviewing the written report by the radiologist.  ED MD interpretation: X-ray revealed displaced olecranon process fracture on the ulnar aspect.  Official radiology report(s): DG Elbow 2 Views Right  Result Date: 08/28/2020 CLINICAL DATA:  Prior fall.  Pain. EXAM: RIGHT ELBOW - 2 VIEW COMPARISON:  No prior. FINDINGS: Patient is casted. Displaced fracture of the olecranon process is present. Multiple corticated bony densities noted about the radial aspect of the elbow, possibly old fracture fragments. Superimposed acute fracture of the radial aspect of the distal humeral condyle cannot be excluded. No dislocation. Probable elbow joint effusion. IMPRESSION: 1. Displaced fracture of the olecranon process.  No dislocation. 2. Multiple corticated bony densities noted about the radial aspect of the elbow possibly old fracture fragments. Superimposed acute fracture of the radial aspect of the distal humeral condyle cannot be excluded. Probable elbow joint effusion. Electronically Signed   By: Maisie Fus  Register   On: 08/28/2020 13:27    ____________________________________________   PROCEDURES  Procedure(s) performed (including Critical Care):  Procedures   ____________________________________________   INITIAL IMPRESSION / ASSESSMENT AND PLAN / ED COURSE  As part of my medical decision making, I reviewed the following data within the electronic MEDICAL RECORD NUMBER         Patient presents for follow-up from the [facility] emergency room for right olecranon process fracture.  Discussed patient with the on-call orthopedics who will see him tomorrow and tentatively schedule surgery for February 32,022.  Patient given discharge care instructions and  a prescription for 3 days of Percocet.      ____________________________________________   FINAL CLINICAL IMPRESSION(S) / ED DIAGNOSES  Final diagnoses:  Olecranon fracture, right, closed, initial encounter     ED Discharge Orders         Ordered    oxyCODONE-acetaminophen (PERCOCET) 7.5-325 MG tablet  Every 6 hours PRN        08/28/20 1411          *Please note:  AMES HOBAN was evaluated in Emergency Department on 08/28/2020 for the symptoms described in the history of present illness. He was evaluated in the context of the global COVID-19 pandemic, which necessitated consideration that the patient might be at risk for infection with the SARS-CoV-2 virus that causes COVID-19. Institutional protocols and algorithms that pertain to the evaluation of patients at risk for COVID-19 are in a state of rapid change based on information released by regulatory bodies including the CDC and federal and state organizations. These policies and algorithms were followed during the patient's care in the ED.  Some ED evaluations and interventions may be delayed as a result of limited staffing during and the pandemic.*   Note:  This document was prepared using Dragon voice recognition software and may include unintentional dictation errors.    Joni Reining, PA-C 08/28/20 1413    Jene Every, MD 08/28/20 [phone]

## 2020-08-29 ENCOUNTER — Other Ambulatory Visit: Payer: Self-pay | Admitting: Orthopedic Surgery

## 2020-08-29 ENCOUNTER — Other Ambulatory Visit
Admission: RE | Admit: 2020-08-29 | Discharge: 2020-08-29 | Disposition: A | Discharge status: Home / Self Care | Payer: 59 | Source: Ambulatory Visit | Attending: Orthopedic Surgery | Admitting: Orthopedic Surgery

## 2020-08-29 DIAGNOSIS — Z01812 Encounter for preprocedural laboratory examination: Secondary | ICD-10-CM | POA: Diagnosis present

## 2020-08-29 DIAGNOSIS — Z20822 Contact with and (suspected) exposure to covid-19: Secondary | ICD-10-CM | POA: Diagnosis not present

## 2020-08-29 MED ORDER — CEFAZOLIN SODIUM-DEXTROSE 2-4 GM/100ML-% IV SOLN
2.0000 g | INTRAVENOUS | Status: AC
  Administered 2020-08-30: 2 g via INTRAVENOUS

## 2020-08-30 ENCOUNTER — Ambulatory Visit: Payer: 59 | Admitting: Anesthesiology

## 2020-08-30 ENCOUNTER — Ambulatory Visit
Admission: RE | Admit: 2020-08-30 | Discharge: 2020-08-30 | Disposition: A | Discharge status: Home / Self Care | Payer: 59 | Attending: Orthopedic Surgery | Admitting: Orthopedic Surgery

## 2020-08-30 ENCOUNTER — Encounter: Payer: Self-pay | Admitting: Orthopedic Surgery

## 2020-08-30 ENCOUNTER — Ambulatory Visit: Payer: 59

## 2020-08-30 ENCOUNTER — Encounter
Admission: RE | Disposition: A | Discharge status: Home / Self Care | Payer: Self-pay | Source: Home / Self Care | Attending: Orthopedic Surgery

## 2020-08-30 ENCOUNTER — Other Ambulatory Visit: Payer: Self-pay

## 2020-08-30 DIAGNOSIS — Z9889 Other specified postprocedural states: Secondary | ICD-10-CM

## 2020-08-30 DIAGNOSIS — S52021A Displaced fracture of olecranon process without intraarticular extension of right ulna, initial encounter for closed fracture: Secondary | ICD-10-CM | POA: Diagnosis present

## 2020-08-30 DIAGNOSIS — W19XXXA Unspecified fall, initial encounter: Secondary | ICD-10-CM | POA: Insufficient documentation

## 2020-08-30 DIAGNOSIS — Z419 Encounter for procedure for purposes other than remedying health state, unspecified: Secondary | ICD-10-CM

## 2020-08-30 DIAGNOSIS — Z8781 Personal history of (healed) traumatic fracture: Secondary | ICD-10-CM

## 2020-08-30 HISTORY — PX: ORIF ELBOW FRACTURE: SHX5031

## 2020-08-30 LAB — BASIC METABOLIC PANEL
Anion gap: 7 (ref 5–15)
BUN: 15 mg/dL (ref 6–20)
CO2: 25 mmol/L (ref 22–32)
Calcium: 8.7 mg/dL — ABNORMAL LOW (ref 8.9–10.3)
Chloride: 100 mmol/L (ref 98–111)
Creatinine, Ser: 0.72 mg/dL (ref 0.61–1.24)
GFR, Estimated: >60 mL/min (ref >60)
Glucose, Bld: 124 mg/dL — ABNORMAL HIGH (ref 70–99)
Potassium: 3.7 mmol/L (ref 3.5–5.1)
Sodium: 132 mmol/L — ABNORMAL LOW (ref 135–145)

## 2020-08-30 LAB — CBC
HCT: 36.6 % — ABNORMAL LOW (ref 39.0–52.0)
Hemoglobin: 13.2 g/dL (ref 13.0–17.0)
MCH: 35.3 pg — ABNORMAL HIGH (ref 26.0–34.0)
MCHC: 36.1 g/dL — ABNORMAL HIGH (ref 30.0–36.0)
MCV: 97.9 fL (ref 80.0–100.0)
Platelets: 147 10*3/uL — ABNORMAL LOW (ref 150–400)
RBC: 3.74 MIL/uL — ABNORMAL LOW (ref 4.22–5.81)
RDW: 11.5 % (ref 11.5–15.5)
WBC: 5.5 10*3/uL (ref 4.0–10.5)
nRBC: 0 % (ref 0.0–0.2)

## 2020-08-30 LAB — SARS CORONAVIRUS 2 (TAT 6-24 HRS): SARS Coronavirus 2: NEGATIVE

## 2020-08-30 SURGERY — OPEN REDUCTION INTERNAL FIXATION (ORIF) ELBOW/OLECRANON FRACTURE
Anesthesia: General | Site: Elbow | Laterality: Right

## 2020-08-30 MED ORDER — PROPOFOL 10 MG/ML IV BOLUS
INTRAVENOUS | Status: DC | PRN
  Administered 2020-08-30: 200 mg via INTRAVENOUS

## 2020-08-30 MED ORDER — OXYCODONE HCL 5 MG/5ML PO SOLN
5.0000 mg | Freq: Once | ORAL | Status: DC | PRN
Start: 2020-08-30 — End: 2020-08-30

## 2020-08-30 MED ORDER — FENTANYL CITRATE (PF) 100 MCG/2ML IJ SOLN
25.0000 ug | INTRAMUSCULAR | Status: DC | PRN
Start: 2020-08-30 — End: 2020-08-30

## 2020-08-30 MED ORDER — LIDOCAINE HCL (CARDIAC) PF 100 MG/5ML IV SOSY
PREFILLED_SYRINGE | INTRAVENOUS | Status: DC | PRN
  Administered 2020-08-30: 100 mg via INTRAVENOUS

## 2020-08-30 MED ORDER — CHLORHEXIDINE GLUCONATE 0.12 % MT SOLN
OROMUCOSAL | Status: AC
  Administered 2020-08-30: 15 mL via OROMUCOSAL
  Filled 2020-08-30: qty 15

## 2020-08-30 MED ORDER — ONDANSETRON HCL 4 MG/2ML IJ SOLN
INTRAMUSCULAR | Status: DC | PRN
  Administered 2020-08-30: 4 mg via INTRAVENOUS

## 2020-08-30 MED ORDER — FENTANYL CITRATE (PF) 100 MCG/2ML IJ SOLN
INTRAMUSCULAR | Status: DC | PRN
  Administered 2020-08-30: 100 ug via INTRAVENOUS

## 2020-08-30 MED ORDER — DEXAMETHASONE SODIUM PHOSPHATE 10 MG/ML IJ SOLN
2.0000 mg | Freq: Once | INTRAMUSCULAR | Status: AC
  Administered 2020-08-30: 10 mg via INTRAVENOUS

## 2020-08-30 MED ORDER — CEFAZOLIN SODIUM-DEXTROSE 2-4 GM/100ML-% IV SOLN
INTRAVENOUS | Status: AC
  Filled 2020-08-30: qty 100

## 2020-08-30 MED ORDER — FENTANYL CITRATE (PF) 100 MCG/2ML IJ SOLN
INTRAMUSCULAR | Status: AC
  Administered 2020-08-30: 50 ug via INTRAVENOUS
  Filled 2020-08-30: qty 2

## 2020-08-30 MED ORDER — FENTANYL CITRATE (PF) 100 MCG/2ML IJ SOLN: 50.0000 ug | Freq: Once | INTRAMUSCULAR | Status: AC

## 2020-08-30 MED ORDER — ACETAMINOPHEN 10 MG/ML IV SOLN
INTRAVENOUS | Status: DC | PRN
  Administered 2020-08-30: 1000 mg via INTRAVENOUS

## 2020-08-30 MED ORDER — ACETAMINOPHEN 10 MG/ML IV SOLN: 1000.0000 mg | Freq: Once | INTRAVENOUS | Status: DC | PRN

## 2020-08-30 MED ORDER — MIDAZOLAM HCL 2 MG/2ML IJ SOLN: 1.0000 mg | Freq: Once | INTRAMUSCULAR | Status: AC

## 2020-08-30 MED ORDER — METOCLOPRAMIDE HCL 10 MG PO TABS: 5.0000 mg | ORAL_TABLET | Freq: Three times a day (TID) | ORAL | Status: DC | PRN

## 2020-08-30 MED ORDER — MIDAZOLAM HCL 2 MG/2ML IJ SOLN
INTRAMUSCULAR | Status: AC
  Administered 2020-08-30: 1 mg via INTRAVENOUS
  Filled 2020-08-30: qty 2

## 2020-08-30 MED ORDER — SODIUM CHLORIDE 0.9 % IV SOLN: INTRAVENOUS | Status: DC

## 2020-08-30 MED ORDER — CHLORHEXIDINE GLUCONATE 0.12 % MT SOLN: 15.0000 mL | Freq: Once | OROMUCOSAL | Status: AC

## 2020-08-30 MED ORDER — OXYCODONE HCL 5 MG PO TABS
5.0000 mg | ORAL_TABLET | Freq: Once | ORAL | Status: DC | PRN
Start: 2020-08-30 — End: 2020-08-30

## 2020-08-30 MED ORDER — SUGAMMADEX SODIUM 200 MG/2ML IV SOLN
INTRAVENOUS | Status: DC | PRN
  Administered 2020-08-30: 200 mg via INTRAVENOUS

## 2020-08-30 MED ORDER — DEXAMETHASONE SODIUM PHOSPHATE 10 MG/ML IJ SOLN
INTRAMUSCULAR | Status: AC
  Filled 2020-08-30: qty 1

## 2020-08-30 MED ORDER — BUPIVACAINE HCL (PF) 0.5 % IJ SOLN
INTRAMUSCULAR | Status: AC
  Filled 2020-08-30: qty 20

## 2020-08-30 MED ORDER — SUCCINYLCHOLINE CHLORIDE 20 MG/ML IJ SOLN
INTRAMUSCULAR | Status: DC | PRN
  Administered 2020-08-30: 120 mg via INTRAVENOUS

## 2020-08-30 MED ORDER — LACTATED RINGERS IV SOLN: INTRAVENOUS | Status: DC

## 2020-08-30 MED ORDER — ONDANSETRON HCL 4 MG/2ML IJ SOLN: 4.0000 mg | Freq: Once | INTRAMUSCULAR | Status: DC | PRN

## 2020-08-30 MED ORDER — ROCURONIUM BROMIDE 100 MG/10ML IV SOLN
INTRAVENOUS | Status: DC | PRN
  Administered 2020-08-30: 30 mg via INTRAVENOUS

## 2020-08-30 MED ORDER — ONDANSETRON HCL 4 MG PO TABS: 4.0000 mg | ORAL_TABLET | Freq: Four times a day (QID) | ORAL | Status: DC | PRN

## 2020-08-30 MED ORDER — PROPOFOL 500 MG/50ML IV EMUL
INTRAVENOUS | Status: AC
  Filled 2020-08-30: qty 50

## 2020-08-30 MED ORDER — ACETAMINOPHEN 10 MG/ML IV SOLN
INTRAVENOUS | Status: AC
  Filled 2020-08-30: qty 100

## 2020-08-30 MED ORDER — ORAL CARE MOUTH RINSE: 15.0000 mL | Freq: Once | OROMUCOSAL | Status: AC

## 2020-08-30 MED ORDER — BUPIVACAINE HCL (PF) 0.5 % IJ SOLN
INTRAMUSCULAR | Status: DC | PRN
  Administered 2020-08-30: 20 mL

## 2020-08-30 MED ORDER — ONDANSETRON HCL 4 MG/2ML IJ SOLN: 4.0000 mg | Freq: Four times a day (QID) | INTRAMUSCULAR | Status: DC | PRN

## 2020-08-30 MED ORDER — FENTANYL CITRATE (PF) 100 MCG/2ML IJ SOLN
INTRAMUSCULAR | Status: AC
  Filled 2020-08-30: qty 2

## 2020-08-30 MED ORDER — OXYCODONE HCL 5 MG PO TABS: 5.0000 mg | ORAL_TABLET | Freq: Three times a day (TID) | ORAL | 0 refills | Status: DC | PRN

## 2020-08-30 MED ORDER — MIDAZOLAM HCL 2 MG/2ML IJ SOLN
1.0000 mg | Freq: Once | INTRAMUSCULAR | Status: AC
  Administered 2020-08-30: 1 mg via INTRAVENOUS

## 2020-08-30 MED ORDER — METOCLOPRAMIDE HCL 5 MG/ML IJ SOLN: 5.0000 mg | Freq: Three times a day (TID) | INTRAMUSCULAR | Status: DC | PRN

## 2020-08-30 MED ORDER — DEXAMETHASONE SODIUM PHOSPHATE 10 MG/ML IJ SOLN
INTRAMUSCULAR | Status: DC | PRN
  Administered 2020-08-30: 2 mg

## 2020-08-30 SURGICAL SUPPLY — 47 items
APL PRP STRL LF DISP 70% ISPRP (MISCELLANEOUS) ×1
BNDG CMPR STD VLCR NS LF 5.8X4 (GAUZE/BANDAGES/DRESSINGS) ×2
BNDG ELASTIC 4X5.8 VLCR NS LF (GAUZE/BANDAGES/DRESSINGS) ×4 IMPLANT
CANISTER SUCT 1200ML W/VALVE (MISCELLANEOUS) ×1 IMPLANT
CHLORAPREP W/TINT 26 (MISCELLANEOUS) ×2 IMPLANT
COVER WAND RF STERILE (DRAPES) ×2 IMPLANT
CUFF TOURN SGL QUICK 18X4 (TOURNIQUET CUFF) IMPLANT
CUFF TOURN SGL QUICK 24 (TOURNIQUET CUFF)
CUFF TRNQT CYL 24X4X16.5-23 (TOURNIQUET CUFF) IMPLANT
DRAPE 3/4 80X56 (DRAPES) ×2 IMPLANT
DRAPE C-ARM XRAY 36X54 (DRAPES) IMPLANT
DRAPE U-SHAPE 47X51 STRL (DRAPES) ×1 IMPLANT
ELECT CAUTERY BLADE 6.4 (BLADE) ×2 IMPLANT
ELECT REM PT RETURN 9FT ADLT (ELECTROSURGICAL) ×2
ELECTRODE REM PT RTRN 9FT ADLT (ELECTROSURGICAL) ×1 IMPLANT
GAUZE SPONGE 4X4 12PLY STRL (GAUZE/BANDAGES/DRESSINGS) ×2 IMPLANT
GAUZE XEROFORM 1X8 LF (GAUZE/BANDAGES/DRESSINGS) ×2 IMPLANT
GLOVE SURG SYN 9.0  PF PI (GLOVE) ×2
GLOVE SURG SYN 9.0 PF PI (GLOVE) ×1 IMPLANT
GOWN SRG 2XL LVL 4 RGLN SLV (GOWNS) ×1 IMPLANT
GOWN STRL NON-REIN 2XL LVL4 (GOWNS) ×2
GOWN STRL REUS W/ TWL LRG LVL3 (GOWN DISPOSABLE) ×1 IMPLANT
GOWN STRL REUS W/TWL LRG LVL3 (GOWN DISPOSABLE) ×2
KIT TURNOVER KIT A (KITS) ×2 IMPLANT
MANIFOLD NEPTUNE II (INSTRUMENTS) ×2 IMPLANT
NDL FILTER BLUNT 18X1 1/2 (NEEDLE) ×1 IMPLANT
NEEDLE FILTER BLUNT 18X 1/2SAF (NEEDLE) ×1
NEEDLE FILTER BLUNT 18X1 1/2 (NEEDLE) ×1 IMPLANT
NS IRRIG 500ML POUR BTL (IV SOLUTION) ×2 IMPLANT
PACK EXTREMITY ARMC (MISCELLANEOUS) ×2 IMPLANT
PAD ABD DERMACEA PRESS 5X9 (GAUZE/BANDAGES/DRESSINGS) ×4 IMPLANT
PAD CAST CTTN 4X4 STRL (SOFTGOODS) ×3 IMPLANT
PAD PREP 24X41 OB/GYN DISP (PERSONAL CARE ITEMS) ×1 IMPLANT
PADDING CAST COTTON 4X4 STRL (SOFTGOODS) ×6
SCALPEL PROTECTED #15 DISP (BLADE) ×4 IMPLANT
SPLINT CAST 1 STEP 5X30 WHT (MISCELLANEOUS) ×2 IMPLANT
SPONGE LAP 18X18 RF (DISPOSABLE) ×2 IMPLANT
STAPLER SKIN PROX 35W (STAPLE) ×2 IMPLANT
STOCKINETTE IMPERVIOUS 9X36 MD (GAUZE/BANDAGES/DRESSINGS) ×1 IMPLANT
STRIP CLOSURE SKIN 1/2X4 (GAUZE/BANDAGES/DRESSINGS) ×2 IMPLANT
SUT ETHILON 3-0 FS-10 30 BLK (SUTURE) ×2
SUT VIC AB 0 CT2 27 (SUTURE) ×2 IMPLANT
SUT VIC AB 2-0 CT2 27 (SUTURE) ×2 IMPLANT
SUTURE EHLN 3-0 FS-10 30 BLK (SUTURE) ×1 IMPLANT
SYR 5ML LL (SYRINGE) ×2 IMPLANT
SYS INTERNAL BRACE KNEE (Miscellaneous) ×2 IMPLANT
SYSTEM INTERNAL BRACE KNEE (Miscellaneous) IMPLANT

## 2020-08-30 NOTE — Anesthesia Procedure Notes (Signed)
Anesthesia Regional Block: Supraclavicular block   Pre-Anesthetic Checklist: ,, timeout performed, Correct Patient, Correct Site, Correct Laterality, Correct Procedure, Correct Position, site marked, Risks and benefits discussed,  Surgical consent,  Pre-op evaluation,  At surgeon's request and post-op pain management  Laterality: Right  Prep: chloraprep       Needles:  Injection technique: Single-shot  Needle Type: Echogenic Needle     Needle Length: 4cm  Needle Gauge: 25     Additional Needles:   Narrative:  Start time: 08/30/2020 11:17 AM End time: 08/30/2020 11:18 AM Injection made incrementally with aspirations every 5 mL.  Performed by: Personally  Anesthesiologist: Corinda Gubler, MD  Additional Notes: Patient's chart reviewed and they were deemed appropriate candidate for procedure. Patient educated about risks, benefits, and alternatives of the block including but not limited to: temporary or permanent nerve damage, hemidiaphragmatic paralysis leading to dyspnea, pneumothorax, bleeding, infection, damage to surround tissues, block failure, local anesthetic toxicity. Patient expressed understanding. A formal time-out was conducted consistent with institution rules.  Monitors were applied, and minimal sedation used (see nursing record). The site was prepped with skin prep and allowed to dry, and sterile gloves were used. A high frequency linear ultrasound probe with probe cover was utilized. Supraclavicular artery visualized, along with the brachial plexus adjacent to it. Local anesthetic injected around the plexus, and echogenic block needle trajectory was monitored throughout. Aspiration performed every 55ml. Lung and blood vessels were avoided. All injections were performed without resistance and free of blood and paresthesias. The patient tolerated the procedure well.  Injectate: 13ml 0.5% bupivacaine + 2mg  dexamethasone

## 2020-08-30 NOTE — Anesthesia Procedure Notes (Signed)
Procedure Name: Intubation Date/Time: 08/30/2020 12:30 PM Performed by: Rodney Booze, CRNA Pre-anesthesia Checklist: Patient identified, Emergency Drugs available, Suction available and Patient being monitored Patient Re-evaluated:Patient Re-evaluated prior to induction Oxygen Delivery Method: Circle system utilized Preoxygenation: Pre-oxygenation with 100% oxygen Induction Type: IV induction Ventilation: Mask ventilation without difficulty Laryngoscope Size: McGraph and 3 Grade View: Grade I Tube type: Oral Tube size: 7.0 mm Number of attempts: 2 (first attempted with miller 3, then miller 2 with grade 3 views and finally mcgrath ) Airway Equipment and Method: Stylet and Oral airway Placement Confirmation: ETT inserted through vocal cords under direct vision,  positive ETCO2 and breath sounds checked- equal and bilateral Secured at: 22 cm Tube secured with: Tape Dental Injury: Teeth and Oropharynx as per pre-operative assessment

## 2020-08-30 NOTE — Progress Notes (Signed)
Blood work pending, no need to wait until block per Dr. Suzan Slick . Pt denies any drug use, no orders for UDS

## 2020-08-30 NOTE — Op Note (Signed)
08/30/2020  1:45 PM  PATIENT:  Alexander Wilkins  60 y.o. male  PRE-OPERATIVE DIAGNOSIS:  Olecranon fracture, right, closed, initial encounter S52.021A  POST-OPERATIVE DIAGNOSIS:  Olecranon fracture, right, closed, initial encounter S52.021A  PROCEDURE:  Procedure(s): OPEN REDUCTION INTERNAL FIXATION (ORIF) ELBOW/OLECRANON FRACTURE (Right)  SURGEON: Leitha Schuller, MD  ASSISTANTS: none  ANESTHESIA:   general  EBL:  Total I/O In: 1200 [I.V.:1000; IV Piggyback:200] Out: 400 [Urine:200; Blood:200]  BLOOD ADMINISTERED:none  DRAINS: none   LOCAL MEDICATIONS USED:  NONE  SPECIMEN:  No Specimen  DISPOSITION OF SPECIMEN:  N/A  COUNTS:  YES  TOURNIQUET:  * No tourniquets in log *  IMPLANTS: Arthrex internal brace system with 2 anchors FiberWire and fiber tape  DICTATION: .Dragon Dictation patient was brought to the operating room and after adequate anesthesia was obtained the right arm was prepped and draped you sterile fashion.  After patient identification timeout procedures were completed posterior lateral incision was made going over the radial side of the olecranon and then going more proximally to expose the triceps tendon.  The proximal ulna was exposed for anchor placement and the fracture site was debrided of fracture hematoma.  The fracture was quite comminuted and it was a fairly thin piece of bone that was left.  First a fiber tape was woven through the triceps tendon more proximal to the bone and passed on the medial and lateral sides distally Next a FiberWire was passed through the bone centrally out the back around the tendon and then back and through the bone so would be coming out through the center of the bone.  These 2 sutures were then passed through the shaft after checking position on C arm with traction applied there is anatomic reduction through a small drill hole the sutures were passed so they would come out through the bone rather than outside the bone is a  advanced to allow for anatomic alignment.  Guidewire placed drill hole made more distal in the ulna and the FiberWire was then tightened down using the suture anchor again C-arm showed anatomic alignment.  The fiber tape was then tightened in a similar fashion with a Meyer distally placed suture anchor and the this gave anatomic alignment on the mini C arm views the repair was stable to 90 degrees and the excess suture then removed.  The wound was irrigated and closed with 2-0 Vicryl subcutaneously and skin staples followed by Xeroform 4 x 4's ABD web roll and medial and lateral splints holding the elbow at 90 degrees flexion with Ace wrap and sling applied.  PLAN OF CARE: Discharge to home after PACU  PATIENT DISPOSITION:  PACU - hemodynamically stable.

## 2020-08-30 NOTE — Progress Notes (Signed)
Pt belongings returned from safe by security. Security verified with pt all belongings present.  Dr. Rosita Kea sent message and requested to come back and speak with patient as he has many questions related to surgery and xray; Dr Rosita Kea reports will return to talk with pt

## 2020-08-30 NOTE — Transfer of Care (Signed)
Immediate Anesthesia Transfer of Care Note  Patient: Alexander Wilkins  Procedure(s) Performed: OPEN REDUCTION INTERNAL FIXATION (ORIF) ELBOW/OLECRANON FRACTURE (Right Elbow)  Patient Location: PACU  Anesthesia Type:General  Level of Consciousness: drowsy  Airway & Oxygen Therapy: Patient Spontanous Breathing and Patient connected to nasal cannula oxygen  Post-op Assessment: Report given to RN and Post -op Vital signs reviewed and stable  Post vital signs: stable  Last Vitals:  Vitals Value Taken Time  BP 98/69 08/30/20 1345  Temp 36.1 C 08/30/20 1342  Pulse 69 08/30/20 1346  Resp 12 08/30/20 1346  SpO2 99 % 08/30/20 1346  Vitals shown include unvalidated device data.  Last Pain:  Vitals:   08/30/20 1052  TempSrc:   PainSc: 3          Complications: No complications documented.

## 2020-08-30 NOTE — Progress Notes (Signed)
PT requesting all belongings be placed in locker with security. Security at bedside and has taken all belongings

## 2020-08-30 NOTE — H&P (Signed)
Chief Complaint  Patient presents with  . Pain  right arm , H&P   Alexander Wilkins is a 60 y.o. male who presents today for evaluation of right elbow proximal ulnar fracture along the olecranon. Patient fell 4 days ago on 08/25/2020 and suffered a fracture to the right elbow. He is left-hand-dominant. X-rays in the ER showed displaced intra-articular olecranon fracture. Pain is 8 out of 10. He has been taking Percocet which seems to be alleviating the pain. No numbness or tingling throughout the right upper extremity. Denies any shoulder pain. 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 Patient's been tolerating sling and splint well.  Past Medical History: History reviewed. No pertinent past medical history.  Past Surgical History: History reviewed. No pertinent surgical history.  Past Family History: History reviewed. No pertinent family history.  Medications: Current Outpatient Medications Ordered in Epic  Medication Sig Dispense Refill  . HYDROcodone-acetaminophen (NORCO) 5-325 mg tablet Take 1 tablet by mouth as needed  . oxyCODONE-acetaminophen (PERCOCET) 7.5-325 mg tablet Take 1 tablet by mouth as needed   No current Epic-ordered facility-administered medications on file.   Allergies: Not on File   Review of Systems:  A comprehensive 14 point ROS was performed, reviewed by me today, and the pertinent orthopaedic findings are documented in the HPI.  Exam: BP 128/86  Ht 188 cm (6\' 2" )  Wt 100.6 kg (221 lb 12.8 oz)  BMI 28.48 kg/m  General:  Well developed, well nourished, no apparent distress, normal affect, normal gait with no antalgic component.   HEENT: Head normocephalic, atraumatic, PERRL.   Abdomen: Soft, non tender, non distended, Bowel sounds present.  Heart: Examination of the heart reveals regular, rate, and rhythm. There is no murmur noted on ascultation. There is a normal apical pulse.  Lungs: Lungs are clear to auscultation. There is no wheeze,  rhonchi, or crackles. There is normal expansion of bilateral chest walls.   Right upper extremity: Right upper extremity shows patient is in a well fitted splint with sling. No swelling throughout the digits. He is able to make a full fist. He is nontender throughout the distal forearm, mid to proximal humerus. No shoulder or clavicle pain. He is tender along the olecranon of the right elbow  EXAM: RIGHT ELBOW - 2 VIEW  COMPARISON: No prior.  FINDINGS: Patient is casted. Displaced fracture of the olecranon process is present. Multiple corticated bony densities noted about the radial aspect of the elbow, possibly old fracture fragments. Superimposed acute fracture of the radial aspect of the distal humeral condyle cannot be excluded. No dislocation. Probable elbow joint effusion.  IMPRESSION: 1. Displaced fracture of the olecranon process. No dislocation. 2. Multiple corticated bony densities noted about the radial aspect of the elbow possibly old fracture fragments. Superimposed acute fracture of the radial aspect of the distal humeral condyle cannot be excluded. Probable elbow joint effusion.   Impression: Olecranon fracture, right, closed, initial encounter [S52.021A] Olecranon fracture, right, closed, initial encounter (primary encounter diagnosis)  Plan:  1. Risks, benefits, complications of a right elbow olecranon ORIF have been discussed with the patient. Patient has agreed and consented procedure Dr. on 08/30/2020. We have discussed postoperative protocols and restrictions as well as postoperative follow-up.  This note was generated in part with voice recognition software and I apologize for any typographical errors that were not detected and corrected.  10/28/2020 MPA-C    Electronically signed by Patience Musca, PA at 08/29/2020 3:30 PM  EST   Plan of Treatment - documented as of this encounter  Upcoming Encounters Upcoming  Encounters  Date Type Specialty Care Team Description  09/03/2020 Post Op Orthopaedics Marlena Clipper, MD  [address]  [facility]  [place], [place] [address]  [phone] (Work)  [phone] ([address])    Patience Musca, [place]  [address]  [place], [place] [address]  [phone] (Work)  [phone] (Fax)    09/13/2020 Post Op Orthopaedics Marlena Clipper, MD  [address]  [facility]  [place], [place] [address]  [phone] (Work)  [phone] ([address]    Patience Musca, [place]  [address]  [place], [place] [address]  [phone] (Work)  [phone] (Fax)     Visit Diagnoses - documented in this encounter  Diagnosis  Olecranon fracture, right, closed, initial encounter - Primary    Reviewed  H+P. No changes noted.

## 2020-08-30 NOTE — Discharge Instructions (Addendum)
Take it easy as much as you can with the right arm.  Keep arm in sling and do not try to move elbow but work on hand motion is much as you can making a fist with the right hand.  Okay to move shoulder as well. Pain medicine as directed. Keep dressing clean and dry until recheck.   AMBULATORY SURGERY  DISCHARGE INSTRUCTIONS   1) The drugs that you were given will stay in your system until tomorrow so for the next 24 hours you should not:  A) Drive an automobile B) Make any legal decisions C) Drink any alcoholic beverage   2) You may resume regular meals tomorrow.  Today it is better to start with liquids and gradually work up to solid foods.  You may eat anything you prefer, but it is better to start with liquids, then soup and crackers, and gradually work up to solid foods.   3) Please notify your doctor immediately if you have any unusual bleeding, trouble breathing, redness and pain at the surgery site, drainage, fever, or pain not relieved by medication.

## 2020-08-30 NOTE — Anesthesia Preprocedure Evaluation (Addendum)
Anesthesia Evaluation  Patient identified by MRN, date of birth, ID band Patient awake  General Assessment Comment:Patient stated that he had a small sip of coffee at 7am; he at first said there was cream in there, then backtracked and said there wasn't. He was informed that withholding this information could possibly lead to danger like aspiration and lung damage. He understood and still maintained that he had the smallest sip of coffee at 7am.  Reviewed: Allergy & Precautions, NPO status , Patient's Chart, lab work & pertinent test results  History of Anesthesia Complications Negative for: history of anesthetic complications  Airway Mallampati: III  TM Distance: >3 FB Neck ROM: Full    Dental no notable dental hx. (+) Teeth Intact   Pulmonary neg sleep apnea, neg COPD, Current Smoker and Patient abstained from smoking., former smoker,    Pulmonary exam normal breath sounds clear to auscultation       Cardiovascular Exercise Tolerance: Good METS(-) hypertension(-) CAD and (-) Past MI negative cardio ROS  (-) dysrhythmias  Rhythm:Regular Rate:Normal - Systolic murmurs    Neuro/Psych negative neurological ROS  negative psych ROS   GI/Hepatic neg GERD  ,(+)     substance abuse  alcohol use and marijuana use,   Endo/Other  neg diabetes  Renal/GU negative Renal ROS     Musculoskeletal   Abdominal   Peds  Hematology   Anesthesia Other Findings No past medical history on file.  Reproductive/Obstetrics                            Anesthesia Physical Anesthesia Plan  ASA: II  Anesthesia Plan: General   Post-op Pain Management:  Regional for Post-op pain   Induction: Intravenous and Rapid sequence  PONV Risk Score and Plan: 2 and Ondansetron, Dexamethasone and Midazolam  Airway Management Planned: Oral ETT  Additional Equipment: None  Intra-op Plan:   Post-operative Plan: Extubation in  OR  Informed Consent: I have reviewed the patients History and Physical, chart, labs and discussed the procedure including the risks, benefits and alternatives for the proposed anesthesia with the patient or authorized representative who has indicated his/her understanding and acceptance.     Dental advisory given  Plan Discussed with: CRNA and Surgeon  Anesthesia Plan Comments: (Discussed risks of anesthesia with patient, including PONV, aspiration, sore throat, lip/dental damage. Rare risks discussed as well, such as cardiorespiratory and neurological sequelae. Patient understands. Patient requested a nerve block. Discussed r/b/a of supraclavicular nerve block, including:  - bleeding, infection, nerve damage - pneumothorax - shortness of breath from hemidiaphragmatic paralysis due to phrenic nerve blockade - poor or non functioning block. Patient understands. )        Anesthesia Quick Evaluation

## 2020-08-31 ENCOUNTER — Encounter: Payer: Self-pay | Admitting: Orthopedic Surgery

## 2020-08-31 NOTE — Anesthesia Postprocedure Evaluation (Signed)
Anesthesia Post Note  Patient: Alexander Wilkins  Procedure(s) Performed: OPEN REDUCTION INTERNAL FIXATION (ORIF) ELBOW/OLECRANON FRACTURE (Right Elbow)  Patient location during evaluation: PACU Anesthesia Type: General Level of consciousness: awake and alert Pain management: pain level controlled Vital Signs Assessment: post-procedure vital signs reviewed and stable Respiratory status: spontaneous breathing, nonlabored ventilation, respiratory function stable and patient connected to nasal cannula oxygen Cardiovascular status: blood pressure returned to baseline and stable Postop Assessment: no apparent nausea or vomiting Anesthetic complications: no   No complications documented.   Last Vitals:  Vitals:   08/30/20 1445 08/30/20 1453  BP: 111/80 128/87  Pulse: 81 73  Resp: 19 16  Temp:  36.6 C  SpO2: 100% 100%    Last Pain:  Vitals:   08/30/20 1453  TempSrc: Temporal  PainSc: 0-No pain                 Corinda Gubler

## 2021-01-25 ENCOUNTER — Telehealth: Payer: Self-pay | Admitting: Family Medicine

## 2021-01-25 DIAGNOSIS — M2550 Pain in unspecified joint: Secondary | ICD-10-CM

## 2021-01-25 NOTE — Progress Notes (Signed)
Patient visit did not occur do to the request being more for long term care needs. And referrals. Website provided to help them find a PCP in the [facility] to get the care needed.

## 2021-02-17 NOTE — Progress Notes (Signed)
History of Present Illness: 60 year old male self-referred for evaluation and management of a couple of issues.  1.  The patient states that he has a low semen volume.  This is been an issue for a long time.  He is able to obtain and maintain erections.  He has no problems with penetration.  He does feel like he has a ejaculation, he just feels that the volume is less than it should be.  He is not on any SSRIs or other psychotropic medications.  He is on no medications for voiding.  2.  The patient states that he has nocturia x10.  This is been going on for over a year.  During the day he does not have urinary frequency or urgency.  He usually has a good stream and feels like he empties well.  When questioned, the patient states that he often drinks 4 pints of beer before bed.  He has had a prior urologic consultation in [place] 20 to 30 years ago.  He does not know why he saw that physician, however.   No past medical history on file.  Past Surgical History:  Procedure Laterality Date   KNEE SURGERY Right    ORIF ELBOW FRACTURE Right 08/30/2020   Procedure: OPEN REDUCTION INTERNAL FIXATION (ORIF) ELBOW/OLECRANON FRACTURE;  Surgeon: Kennedy Bucker, MD;  Location: [facility] ORS;  Service: Orthopedics;  Laterality: Right;   WRIST SURGERY Left     Home Medications:  Allergies as of 02/19/2021       Reactions   Tramadol Hives        Medication List        Accurate as of February 17, 2021  1:40 PM. If you have any questions, ask your nurse or doctor.          HYDROcodone-acetaminophen 5-325 MG tablet Commonly known as: NORCO/VICODIN Take 1 tablet by mouth daily as needed for pain.   naproxen 500 MG tablet Commonly known as: NAPROSYN Take 500 mg by mouth 2 (two) times daily.   oxyCODONE 5 MG immediate release tablet Commonly known as: Roxicodone Take 1 tablet (5 mg total) by mouth every 8 (eight) hours as needed.   oxyCODONE-acetaminophen 7.5-325 MG tablet Commonly known as:  Percocet Take 1 tablet by mouth every 6 (six) hours as needed.        Allergies:  Allergies  Allergen Reactions   Tramadol Hives    No family history on file.  Social History:  reports that he has quit smoking. He has never used smokeless tobacco. He reports current alcohol use. He reports that he does not use drugs.  ROS: A complete review of systems was performed.  All systems are negative except for pertinent findings as noted.  Physical Exam:  Vital signs in last 24 hours: There were no vitals taken for this visit. Constitutional:  Alert and oriented, No acute distress Cardiovascular: Regular rate  Respiratory: Normal respiratory effort GI: Abdomen is soft, nontender, nondistended, no abdominal masses. No CVAT.  Genitourinary: Normal male phallus, testes are descended bilaterally and non-tender and without masses, scrotum is normal in appearance without lesions or masses, perineum is normal on inspection.  Prostate 30 g, symmetrical, nonnodular, nontender Lymphatic: No lymphadenopathy Neurologic: Grossly intact, no focal deficits Psychiatric: Normal mood and affect  The patient has no prior notes  Review of bladder scan performed-volume 80 mL before he gave his urine specimen.  Last void well over an hour ago.   Impression/Assessment:  1.  Ejaculatory dysfunction.  I  told the patient that the average semen volume is approximately 1-1/2 to 2-1/2 mL.  He was reassured  2.  Nocturia.  Question if this may be a sleep disorder  Plan:  1.  I reassured the patient that, more than likely, he has a normal semen volume.  No further evaluation needed  2.  Suggestions to limit nocturia discussed with him  3.  I would recommend that, with him having more nighttime than daytime symptoms, he seek a doctor for sleep disorders.  4.  Return in 6 months  5.  Significantly limit the amount of beer that he drinks in the evening.

## 2021-02-19 ENCOUNTER — Other Ambulatory Visit: Payer: Self-pay

## 2021-02-19 ENCOUNTER — Ambulatory Visit (INDEPENDENT_AMBULATORY_CARE_PROVIDER_SITE_OTHER): Payer: 59 | Admitting: Urology

## 2021-02-19 ENCOUNTER — Encounter: Payer: Self-pay | Admitting: Urology

## 2021-02-19 VITALS — BP 118/81 | HR 83

## 2021-02-19 DIAGNOSIS — N400 Enlarged prostate without lower urinary tract symptoms: Secondary | ICD-10-CM

## 2021-02-19 DIAGNOSIS — R32 Unspecified urinary incontinence: Secondary | ICD-10-CM

## 2021-02-19 DIAGNOSIS — R351 Nocturia: Secondary | ICD-10-CM | POA: Diagnosis not present

## 2021-02-19 DIAGNOSIS — N5201 Erectile dysfunction due to arterial insufficiency: Secondary | ICD-10-CM | POA: Diagnosis not present

## 2021-02-19 DIAGNOSIS — N5319 Other ejaculatory dysfunction: Secondary | ICD-10-CM

## 2021-02-19 LAB — URINALYSIS, ROUTINE W REFLEX MICROSCOPIC
Bilirubin, UA: NEGATIVE
Glucose, UA: NEGATIVE
Ketones, UA: NEGATIVE
Nitrite, UA: NEGATIVE
Protein,UA: NEGATIVE
RBC, UA: NEGATIVE
Specific Gravity, UA: 1.02 (ref 1.005–1.030)
Urobilinogen, Ur: 0.2 mg/dL (ref 0.2–1.0)
pH, UA: 6.5 (ref 5.0–7.5)

## 2021-02-19 MED ORDER — SILDENAFIL CITRATE 100 MG PO TABS: ORAL_TABLET | ORAL | 99 refills | Status: DC

## 2021-02-19 NOTE — Progress Notes (Signed)
Urological Symptom Review  Patient is experiencing the following symptoms: Get up at night to urinate Erection problems (male only)   Review of Systems  Gastrointestinal (upper)  : Negative for upper GI symptoms  Gastrointestinal (lower) : Negative for lower GI symptoms  Constitutional : Negative for symptoms  Skin: Negative for skin symptoms  Eyes: Negative for eye symptoms  Ear/Nose/Throat : Negative for Ear/Nose/Throat symptoms  Hematologic/Lymphatic: Negative for Hematologic/Lymphatic symptoms  Cardiovascular : Negative for cardiovascular symptoms  Respiratory : Negative for respiratory symptoms  Endocrine: Negative for endocrine symptoms  Musculoskeletal: Negative for musculoskeletal symptoms  Neurological: Negative for neurological symptoms  Psychologic: Negative for psychiatric symptoms 

## 2021-02-24 ENCOUNTER — Telehealth: Payer: 59 | Admitting: Physician Assistant

## 2021-02-24 ENCOUNTER — Encounter: Payer: Self-pay | Admitting: Physician Assistant

## 2021-02-24 DIAGNOSIS — M109 Gout, unspecified: Secondary | ICD-10-CM | POA: Diagnosis not present

## 2021-02-24 DIAGNOSIS — M79674 Pain in right toe(s): Secondary | ICD-10-CM | POA: Diagnosis not present

## 2021-02-24 MED ORDER — COLCHICINE 0.6 MG PO TABS: ORAL_TABLET | ORAL | 0 refills | Status: DC

## 2021-02-24 MED ORDER — NAPROXEN 500 MG PO TABS: 500.0000 mg | ORAL_TABLET | Freq: Two times a day (BID) | ORAL | 0 refills | Status: DC

## 2021-02-24 NOTE — Progress Notes (Signed)
Based on what you shared with me, I feel your condition warrants further evaluation and I recommend that you be seen in a face to face visit or you may also connect with [place] via video visit today. You can access the video visit through Mychart Mr. Garrelts,  Since this is the first time you may have gout and has not been diagnosed with it previously, we would need to ask additional questions to ensure this not a skin infection vs gout. I tried to call and get more information- called your house number and called your personal cell phone number and left voicemail.     NOTE: There will be NO CHARGE for this eVisit   If you are having a true medical emergency please call 911.      For an urgent face to face visit, [facility] has six urgent care centers for your convenience:     [facility] Health [facility] Center at [facility] Directions [phone] [address] [place], [place] [address]    [facility]) Get Driving Directions [phone] [address] [place], [place] [address]  [facility] Center [facility]) Get Driving Directions [phone] [address] [place],  [place]  [address]  [facility] Health Urgent Care at [facility] Get Driving Directions [phone] [address] [place], [place] [address]   [facility] Health Urgent Care at [facility] Get Driving Directions  [phone] [address] [place], [place] [address]   [facility] at [facility] Directions [phone] [address] [place], [place] [address]  Your MyChart E-visit questionnaire answers were reviewed by a board certified advanced clinical practitioner to complete your personal care plan based on your specific symptoms.  Thank you for using e-Visits.   I spent 5-10 minutes on review and completion of this note- Ill[facility] Level [facility]

## 2021-02-24 NOTE — Addendum Note (Signed)
Addended by: Demetrio Lapping on: 02/24/2021 02:10 PM   Modules accepted: Orders

## 2021-02-24 NOTE — Progress Notes (Signed)
E-Visit for Gout Symptoms  We are sorry that you are not feeling well. We are here to help!  Based on what you shared with me it looks like you have a flare of your gout.  Gout is a form of arthritis. It can cause pain and swelling in the joints. At first, it tends to affect only 1 joint - most frequently the big toe. It happens in people who have too much uric acid in the blood. Uric acid is a chemical that is produced when the body breaks down certain foods. Uric acid can form sharp needle-like crystals that build up in the joints and cause pain. Uric acid crystals can also form inside the tubes that carry urine from the kidneys to the bladder. These crystals can turn into "kidney stones" that can cause pain and problems with the flow of urine. People with gout get sudden "flares" or attacks of severe pain, most often the big toe, ankle, or knee. Often the joint also turns red and swells. Usually, only 1 joint is affected, but some people have pain in more than 1 joint. Gout flares tend to happen more often during the night.  The pain from gout can be extreme. The pain and swelling are worst at the beginning of a gout flare. The symptoms then get better within a few days to weeks. It is not clear how the body "turns off" a gout flare.  Do not start any NEW preventative medicine until the gout has cleared completely. However, If you are already on Probenecid or Allopurinol for CHRONIC gout, you may continue taking this during an active flare up   I have prescribed Naprosyn 500 mg twice daily as needed for mild pain for 7 days and I have prescribed Colchicine 0.6 mg tabs - Take 2 tabs immediately, then 1 tab twice per day for the duration of the flare up to a max of 7 days (but discontinue for stomach pains or diarrhea)    Since this is a suspected gout attack, please be cautious- any new symptoms or if prescribed medications does not help, please have a face to face visit for further  evaluation.  HOME CARE Losing weight can help relieve gout. It's not clear that following a specific diet plan will help with gout symptoms but eating a balanced diet can help improve your overall health. It can also help you lose weight, if you are overweight. In general, a healthy diet includes plenty of fruits, vegetables, whole grains, and low-fat dairy products (labelled "low fat", skim, 2%). Avoid sugar sweetened drinks (including sodas, tea, juice and juice blends, coffee drinks and sports drinks) Limit alcohol to 1-2 drinks of beer, spirits or wine daily these can make gout flares worse. Some people with gout also have other health problems, such as heart disease, high blood pressure, kidney disease, or obesity. If you have any of these issues, it's important to work with your doctor to manage them. This can help improve your overall health and might also help with your gout.  GET HELP RIGHT AWAY IF: Your symptoms persist after you have completed your treatment plan You develop severe diarrhea You develop abnormal sensations  You develop vomiting,   You develop weakness  You develop abdominal pain  FOLLOW UP WITH YOUR PRIMARY PROVIDER IF: If your symptoms do not improve within 10 days  MAKE SURE YOU  Understand these instructions. Will watch your condition. Will get help right away if you are not doing well  or get worse.  Thank you for choosing an e-visit.  Your e-visit answers were reviewed by a board certified advanced clinical practitioner to complete your personal care plan. Depending upon the condition, your plan could have included both over the counter or prescription medications.  Please review your pharmacy choice. Make sure the pharmacy is open so you can pick up prescription now. If there is a problem, you may contact your provider through [employer] and have the prescription routed to another pharmacy.  Your safety is important to [place]. If you have drug allergies  check your prescription carefully.   For the next 24 hours you can use MyChart to ask questions about today's visit, request a non-urgent call back, or ask for a work or school excuse. You will get an email in the next two days asking about your experience. I hope that your e-visit has been valuable and will speed your recovery. I spent 5-10 minutes on review and completion of this note- Ill[facility] Level [facility]

## 2021-07-16 ENCOUNTER — Other Ambulatory Visit (HOSPITAL_COMMUNITY): Payer: Self-pay | Admitting: Family Medicine

## 2021-07-16 ENCOUNTER — Other Ambulatory Visit: Payer: Self-pay | Admitting: Family Medicine

## 2021-07-16 DIAGNOSIS — Z87891 Personal history of nicotine dependence: Secondary | ICD-10-CM

## 2021-07-23 ENCOUNTER — Encounter: Payer: Self-pay | Admitting: *Deleted

## 2021-08-06 ENCOUNTER — Encounter: Payer: Self-pay | Admitting: *Deleted

## 2021-08-07 ENCOUNTER — Other Ambulatory Visit (HOSPITAL_BASED_OUTPATIENT_CLINIC_OR_DEPARTMENT_OTHER): Payer: Self-pay | Admitting: Orthopaedic Surgery

## 2021-08-07 ENCOUNTER — Telehealth: Payer: Self-pay | Admitting: Orthopedic Surgery

## 2021-08-07 DIAGNOSIS — M25562 Pain in left knee: Secondary | ICD-10-CM

## 2021-08-07 NOTE — Telephone Encounter (Signed)
Patient called asked for immediate appointment for today for contusion of upper leg - has appointment elsewhere for tomorrow, 08/08/21; said he was in pain and was hoping to be seen today. Relayed to check with office where he is scheduled tomorrow in event they can see sooner, or primary care, urgent care, or emergency room for options of care for today, due to no availability at our clinic today.

## 2021-08-08 ENCOUNTER — Ambulatory Visit (HOSPITAL_BASED_OUTPATIENT_CLINIC_OR_DEPARTMENT_OTHER): Payer: Self-pay | Admitting: Orthopaedic Surgery

## 2021-08-26 ENCOUNTER — Encounter (HOSPITAL_COMMUNITY): Payer: Self-pay

## 2021-08-26 ENCOUNTER — Ambulatory Visit (HOSPITAL_COMMUNITY): Payer: 59

## 2021-08-26 NOTE — Progress Notes (Incomplete)
History of Present Illness: Pt here for followup. !st visit 7.26.2022:   1.  The patient states that he has a low semen volume.  This is been an issue for a long time.  He is able to obtain and maintain erections.  He has no problems with penetration.  He does feel like he has a ejaculation, he just feels that the volume is less than it should be.  He is not on any SSRIs or other psychotropic medications.  He is on no medications for voiding.  2.  The patient states that he has nocturia x10.  This is been going on for over a year.  During the day he does not have urinary frequency or urgency.  He usually has a good stream and feels like he empties well.  When questioned, the patient states that he often drinks 4 pints of beer before bed.  He has had a prior urologic consultation in [place] 20 to 30 years ago.  He does not know why he saw that physician, however.  He was told to limit alcohol intake at night, seek evaluation fo rpossibleOSA, reassured about nml semen volume.  No past medical history on file.  Past Surgical History:  Procedure Laterality Date   KNEE SURGERY Right    ORIF ELBOW FRACTURE Right 08/30/2020   Procedure: OPEN REDUCTION INTERNAL FIXATION (ORIF) ELBOW/OLECRANON FRACTURE;  Surgeon: Kennedy Bucker, MD;  Location: [facility] ORS;  Service: Orthopedics;  Laterality: Right;   WRIST SURGERY Left     Home Medications:  Allergies as of 08/27/2021       Reactions   Tramadol Hives        Medication List        Accurate as of August 26, 2021  7:18 PM. If you have any questions, ask your nurse or doctor.          colchicine 0.6 MG tablet Take 2 tabs immediately, then 1 tab twice per day for 7 days.   naproxen 500 MG tablet Commonly known as: Naprosyn Take 1 tablet (500 mg total) by mouth 2 (two) times daily with a meal.   sildenafil 100 MG tablet Commonly known as: VIAGRA 1/2-1 30 minutes before sex as needed        Allergies:  Allergies  Allergen Reactions    Tramadol Hives    No family history on file.  Social History:  reports that he has quit smoking. He has never used smokeless tobacco. He reports current alcohol use. He reports that he does not use drugs.  ROS: A complete review of systems was performed.  All systems are negative except for pertinent findings as noted.  Physical Exam:  Vital signs in last 24 hours: There were no vitals taken for this visit. Constitutional:  Alert and oriented, No acute distress Cardiovascular: Regular rate  Respiratory: Normal respiratory effort GI: Abdomen is soft, nontender, nondistended, no abdominal masses. No CVAT.  Genitourinary: Normal male phallus, testes are descended bilaterally and non-tender and without masses, scrotum is normal in appearance without lesions or masses, perineum is normal on inspection. Lymphatic: No lymphadenopathy Neurologic: Grossly intact, no focal deficits Psychiatric: Normal mood and affect  I have reviewed prior pt notes  I have reviewed notes from referring/previous physicians  I have reviewed urinalysis results  I have independently reviewed prior imaging  I have reviewed prior PSA results  I have reviewed prior urine culture   Impression/Assessment:  ***  Plan:  ***

## 2021-08-27 ENCOUNTER — Ambulatory Visit: Payer: 59 | Admitting: Urology

## 2021-08-27 DIAGNOSIS — N5319 Other ejaculatory dysfunction: Secondary | ICD-10-CM

## 2021-08-27 DIAGNOSIS — R351 Nocturia: Secondary | ICD-10-CM

## 2021-08-27 DIAGNOSIS — N5201 Erectile dysfunction due to arterial insufficiency: Secondary | ICD-10-CM

## 2021-08-28 ENCOUNTER — Ambulatory Visit (HOSPITAL_COMMUNITY): Payer: 59

## 2021-09-10 ENCOUNTER — Ambulatory Visit: Payer: 59 | Admitting: Urology

## 2021-09-10 DIAGNOSIS — S99922A Unspecified injury of left foot, initial encounter: Secondary | ICD-10-CM | POA: Diagnosis not present

## 2021-09-12 ENCOUNTER — Ambulatory Visit: Payer: 59

## 2021-10-03 ENCOUNTER — Ambulatory Visit: Payer: Self-pay | Admitting: Gastroenterology

## 2021-10-07 NOTE — Progress Notes (Unsigned)
Referring Provider: [facility]  Primary Care Physician:  [facility]  Primary GI:   Patient Location: Home   Provider Location: [facility] office   Reason for Visit:    Persons present on the virtual encounter, with roles:    Total time (minutes) spent on medical discussion: ### minutes  Virtual Visit via Telephone Note Due to COVID-19, visit is conducted virtually and was requested by patient.   I connected withNAME@ on 10/07/21 at 11:30 AM EDT by telephone*** and verified that I am speaking with the correct person using two identifiers.   I discussed the limitations, risks, security and privacy concerns of performing an evaluation and management service by telephone*** and the availability of in person appointments. I also discussed with the patient that there may be a patient responsible charge related to this service. The patient expressed understanding and agreed to proceed.  No chief complaint on file.    History of Present Illness: Alexander Wilkins is a 61 year old male [facility].  No past medical history on file.   Past Surgical History:  Procedure Laterality Date   KNEE SURGERY Right    ORIF ELBOW FRACTURE Right 08/30/2020   Procedure: OPEN REDUCTION INTERNAL FIXATION (ORIF) ELBOW/OLECRANON FRACTURE;  Surgeon: Kennedy Bucker, MD;  Location: [facility] ORS;  Service: Orthopedics;  Laterality: Right;   WRIST SURGERY Left      No outpatient medications have been marked as taking for the 10/09/21 encounter (Appointment) with Letta Median, PA-C.     No family history on file.  Social History   Socioeconomic History   Marital status: Married    Spouse name: Not on file   Number of children: Not on file   Years of education: Not on file   Highest education level: Not on file  Occupational History   Not on file  Tobacco Use   Smoking status: Former    Smokeless tobacco: Never  Substance and Sexual Activity   Alcohol use: Yes    Comment: occ   Drug use: No   Sexual activity: Not on file  Other Topics Concern   Not on file  Social History Narrative   Not on file   Social Determinants of Health   Financial Resource Strain: Not on file  Food Insecurity: Not on file  Transportation Needs: Not on file  Physical Activity: Not on file  Stress: Not on file  Social Connections: Not on file       Review of Systems: Gen: Denies fever, chills, anorexia. Denies fatigue, weakness, weight loss.  CV: Denies chest pain, palpitations, syncope, peripheral edema, and claudication. Resp: Denies dyspnea at rest, cough, wheezing, coughing up blood, and pleurisy. GI: see HPI Derm: Denies rash, itching, dry skin Psych: Denies depression, anxiety, memory loss, confusion. No homicidal or suicidal ideation.  Heme: Denies bruising, bleeding, and enlarged lymph nodes.  Observations/Objective: No distress. Alert and oriented. Pleasant. Well nourished. Normal mood and affect. Unable to perform complete physical exam due to telephone*** encounter. No video available.    Assessment and Plan:   Follow Up Instructions:    I discussed the assessment and treatment plan with the patient. The patient was provided an opportunity to ask questions and all were answered. The patient agreed with the plan and demonstrated an understanding of the instructions.   The patient was advised to call back or seek an in-person evaluation if the  symptoms worsen or if the condition fails to improve as anticipated.  I provided *** minutes of non-face-to-face time during this encounter.  Ermalinda Memos, PA-C [facility]

## 2021-10-09 ENCOUNTER — Other Ambulatory Visit: Payer: Self-pay

## 2021-10-09 ENCOUNTER — Encounter: Payer: Self-pay | Admitting: Gastroenterology

## 2021-10-09 ENCOUNTER — Telehealth (INDEPENDENT_AMBULATORY_CARE_PROVIDER_SITE_OTHER): Payer: 59 | Admitting: Gastroenterology

## 2021-10-09 ENCOUNTER — Telehealth: Payer: Self-pay | Admitting: *Deleted

## 2021-10-09 VITALS — Ht 74.0 in | Wt 205.0 lb

## 2021-10-09 DIAGNOSIS — Z1211 Encounter for screening for malignant neoplasm of colon: Secondary | ICD-10-CM

## 2021-10-09 DIAGNOSIS — B1920 Unspecified viral hepatitis C without hepatic coma: Secondary | ICD-10-CM

## 2021-10-09 DIAGNOSIS — R69 Illness, unspecified: Secondary | ICD-10-CM | POA: Diagnosis not present

## 2021-10-09 DIAGNOSIS — B182 Chronic viral hepatitis C: Secondary | ICD-10-CM | POA: Insufficient documentation

## 2021-10-09 NOTE — Telephone Encounter (Signed)
Alexander Wilkins, you are scheduled for a virtual visit with your provider today.  Just as we do with appointments in the office, we must obtain your consent to participate.  Your consent will be active for this visit and any virtual visit you may have with one of our providers in the next 365 days.  If you have a MyChart account, I can also send a copy of this consent to you electronically.  All virtual visits are billed to your insurance company just like a traditional visit in the office.  As this is a virtual visit, video technology does not allow for your provider to perform a traditional examination.  This may limit your provider's ability to fully assess your condition.  If your provider identifies any concerns that need to be evaluated in person or the need to arrange testing such as labs, EKG, etc, we will make arrangements to do so.  Although advances in technology are sophisticated, we cannot ensure that it will always work on either your end or our end.  If the connection with a video visit is poor, we may have to switch to a telephone visit.  With either a video or telephone visit, we are not always able to ensure that we have a secure connection.   I need to obtain your verbal consent now.   Are you willing to proceed with your visit today?  ?

## 2021-10-09 NOTE — Patient Instructions (Signed)
We will arrange for you to have a colonoscopy in the near future with Dr. Marletta Lor. ? ?I am requesting your blood work from primary care for review.  I will reach out to you as soon as I am able to review this with further recommendations.  If you have not heard back from [place] within a week, do not hesitate to reach back out. ? ?It was nice meeting you today! ? ?Ermalinda Memos, PA-C ?[facility] ? ?

## 2021-10-09 NOTE — Telephone Encounter (Signed)
Pt consented to a virtual visit. 

## 2021-10-10 ENCOUNTER — Telehealth: Payer: Self-pay | Admitting: *Deleted

## 2021-10-10 MED ORDER — PEG 3350-KCL-NA BICARB-NACL 420 G PO SOLR: ORAL | 0 refills | Status: DC

## 2021-10-10 NOTE — Telephone Encounter (Signed)
Called pt. He has been scheduled for tcs w/ propofol Dr. Abbey Chatters asa 2 on 4/4 at 9:30am. Aware will mail prep instructions and send rx to pharmacy.  ?

## 2021-10-15 ENCOUNTER — Encounter: Payer: Self-pay | Admitting: *Deleted

## 2021-10-15 ENCOUNTER — Other Ambulatory Visit: Payer: Self-pay | Admitting: *Deleted

## 2021-10-15 ENCOUNTER — Telehealth: Payer: Self-pay | Admitting: Gastroenterology

## 2021-10-15 DIAGNOSIS — Z114 Encounter for screening for human immunodeficiency virus [HIV]: Secondary | ICD-10-CM

## 2021-10-15 DIAGNOSIS — B1920 Unspecified viral hepatitis C without hepatic coma: Secondary | ICD-10-CM

## 2021-10-15 DIAGNOSIS — B20 Human immunodeficiency virus [HIV] disease: Secondary | ICD-10-CM

## 2021-10-15 NOTE — Addendum Note (Signed)
Addended by: Armstead Peaks on: 10/15/2021 03:53 PM ? ? Modules accepted: Orders ? ?

## 2021-10-15 NOTE — Telephone Encounter (Signed)
Spoke to pt, tried to inform him or results and recommendations. He asked me if I could e-mail him. I sent him a MyChart message.    ?

## 2021-10-15 NOTE — Addendum Note (Signed)
Addended by: Faythe Dingwall on: 10/15/2021 04:20 PM ? ? Modules accepted: Orders ? ?

## 2021-10-15 NOTE — Telephone Encounter (Signed)
[place] appt details sent via [place] ?

## 2021-10-15 NOTE — Telephone Encounter (Signed)
Noted  

## 2021-10-15 NOTE — Telephone Encounter (Addendum)
Received lab work from PCP. ? ?07/16/2021 ?CMP: Glucose 112 (H), creatinine 0.74, sodium 134 (L), potassium 4.2, chloride 104, calcium 8.9, albumin 3.5 (L), total bilirubin 0.5, alk phos 107, AST 172 (H), ALT 132 (H). ?CBC: WBC 4.9, hemoglobin 14.1, hematocrit 39.3, MCV 101.6 (H), MCH 36.4 (H), MCHC 35.9, platelets 112 (L). ?Hemoglobin A1c 5.2 ? ?07/25/2021 ?Hepatitis panel: ?Hepatitis A antibody total nonreactive, hepatitis B surface antibody nonreactive, hepatitis B surface antigen nonreactive, hepatitis B core antibody nonreactive, hepatitis C antibody reactive >11.  ? ?Hepatitis C RNA 849,000. ? ?Vitamin B12 4 3 ?Folate >24 ? ? ?Courtney: ?Please let patient know I received and reviewed his blood work from PCP completed in December.  He does have evidence of active hepatitis C and his liver enzymes are elevated. His platelets are also low.  ? ?We need to update his labs and complete some additional blood work and imaging prior to submitting for Hep C treatment.  ? ?Please arrange:  ?CBC, CMP, INR, hepatitis C genotype, HIV. Dx: Hepatitis C, need for HIV screening ? ? ?Please also give him the following precautions regarding Hep C:  ?Avoid sharing personal equipment including toothbrushes, dental, or shaving equipment with others. ?Cover any bleeding wound to prevent the possibility of others coming into contact with your blood. ?No donating blood. ?Clean blood spills with 1 part bleach to 9 parts water wearing gloves. ? ? ? ?[facility] Clinical Pool:  ?Please arrange abdominal US complete with elastography.  ?Dx: Hepatitis C ? ? ?

## 2021-10-15 NOTE — Addendum Note (Signed)
Addended by: Faythe Dingwall on: 10/15/2021 04:23 PM ? ? Modules accepted: Orders ? ?

## 2021-10-17 ENCOUNTER — Other Ambulatory Visit: Payer: Self-pay | Admitting: *Deleted

## 2021-10-17 DIAGNOSIS — B1920 Unspecified viral hepatitis C without hepatic coma: Secondary | ICD-10-CM

## 2021-10-22 ENCOUNTER — Telehealth: Payer: Self-pay | Admitting: Internal Medicine

## 2021-10-22 ENCOUNTER — Other Ambulatory Visit: Payer: Self-pay | Admitting: *Deleted

## 2021-10-22 ENCOUNTER — Other Ambulatory Visit: Payer: Self-pay

## 2021-10-22 ENCOUNTER — Ambulatory Visit (HOSPITAL_COMMUNITY): Admission: RE | Admit: 2021-10-22 | Payer: 59 | Source: Ambulatory Visit

## 2021-10-22 DIAGNOSIS — Z1211 Encounter for screening for malignant neoplasm of colon: Secondary | ICD-10-CM

## 2021-10-22 NOTE — Telephone Encounter (Signed)
Noted.  ? ?Toni Amend, please place orders for cologuard. Dx: Colon cancer screening.  ?

## 2021-10-22 NOTE — Telephone Encounter (Signed)
Order placed

## 2021-10-22 NOTE — Telephone Encounter (Signed)
Called pt. He wants to do something "less invasive and not take all that damn medication". Plus he doesn't want to be on clear liquids for 1.5 days. He wants to do a cologuard. Advised will send message to provider although even if so and it was positive he would still need TCS. He just wants to do cologaurd because he knows they are accurate. Message sent to endo to cancel. Sending to [facility] to advise regarding cologuard ?

## 2021-10-22 NOTE — Telephone Encounter (Signed)
Pt wants to cancel his colonoscopy with Dr Abbey Chatters on 10/29/2021. He said he would rather do the smear, because it was pretty accurate and he wouldn't have to take "all those damn medicines". Then he asked if the pharmacy would refund his prep. I told him he would have to contact them.  ?

## 2021-10-28 ENCOUNTER — Telehealth: Payer: Self-pay | Admitting: Gastroenterology

## 2021-10-28 ENCOUNTER — Other Ambulatory Visit: Payer: Self-pay | Admitting: *Deleted

## 2021-10-28 DIAGNOSIS — B1920 Unspecified viral hepatitis C without hepatic coma: Secondary | ICD-10-CM

## 2021-10-28 DIAGNOSIS — Z114 Encounter for screening for human immunodeficiency virus [HIV]: Secondary | ICD-10-CM

## 2021-10-28 NOTE — Telephone Encounter (Signed)
Spoke to pt, remained him to have labs completed. Pt voiced understanding. Labs were faxed to [facility], that's were he wants them done at.   ?

## 2021-10-28 NOTE — Addendum Note (Signed)
Addended by: Inda Castle on: 10/28/2021 03:51 PM ? ? Modules accepted: Orders ? ?

## 2021-10-28 NOTE — Telephone Encounter (Signed)
Alexander Wilkins, can you call patient to remind him to have labs completed?  ?

## 2021-10-28 NOTE — Telephone Encounter (Signed)
Spoke to pt, informed him that labs were faxed to [facility].  ?

## 2021-10-28 NOTE — Progress Notes (Signed)
error 

## 2021-10-28 NOTE — Telephone Encounter (Signed)
Pt called asking for his lab orders be sent to [facility] so he can have them done there. [phone] ?

## 2021-10-29 ENCOUNTER — Ambulatory Visit (HOSPITAL_COMMUNITY): Admission: RE | Admit: 2021-10-29 | Payer: 59 | Source: Home / Self Care

## 2021-10-29 ENCOUNTER — Encounter (HOSPITAL_COMMUNITY): Admission: RE | Payer: Self-pay | Source: Home / Self Care

## 2021-10-29 SURGERY — COLONOSCOPY WITH PROPOFOL
Anesthesia: Monitor Anesthesia Care

## 2021-11-09 DIAGNOSIS — Z1211 Encounter for screening for malignant neoplasm of colon: Secondary | ICD-10-CM | POA: Diagnosis not present

## 2021-11-09 IMAGING — DX DG ELBOW 2V*R*
3 series · 3 of 3 positions shown · non-contrast
Comparison: No prior.

CLINICAL DATA: Prior fall.  Pain.

EXAM:
RIGHT ELBOW - 2 VIEW

[elbow ap (1 of 2)]
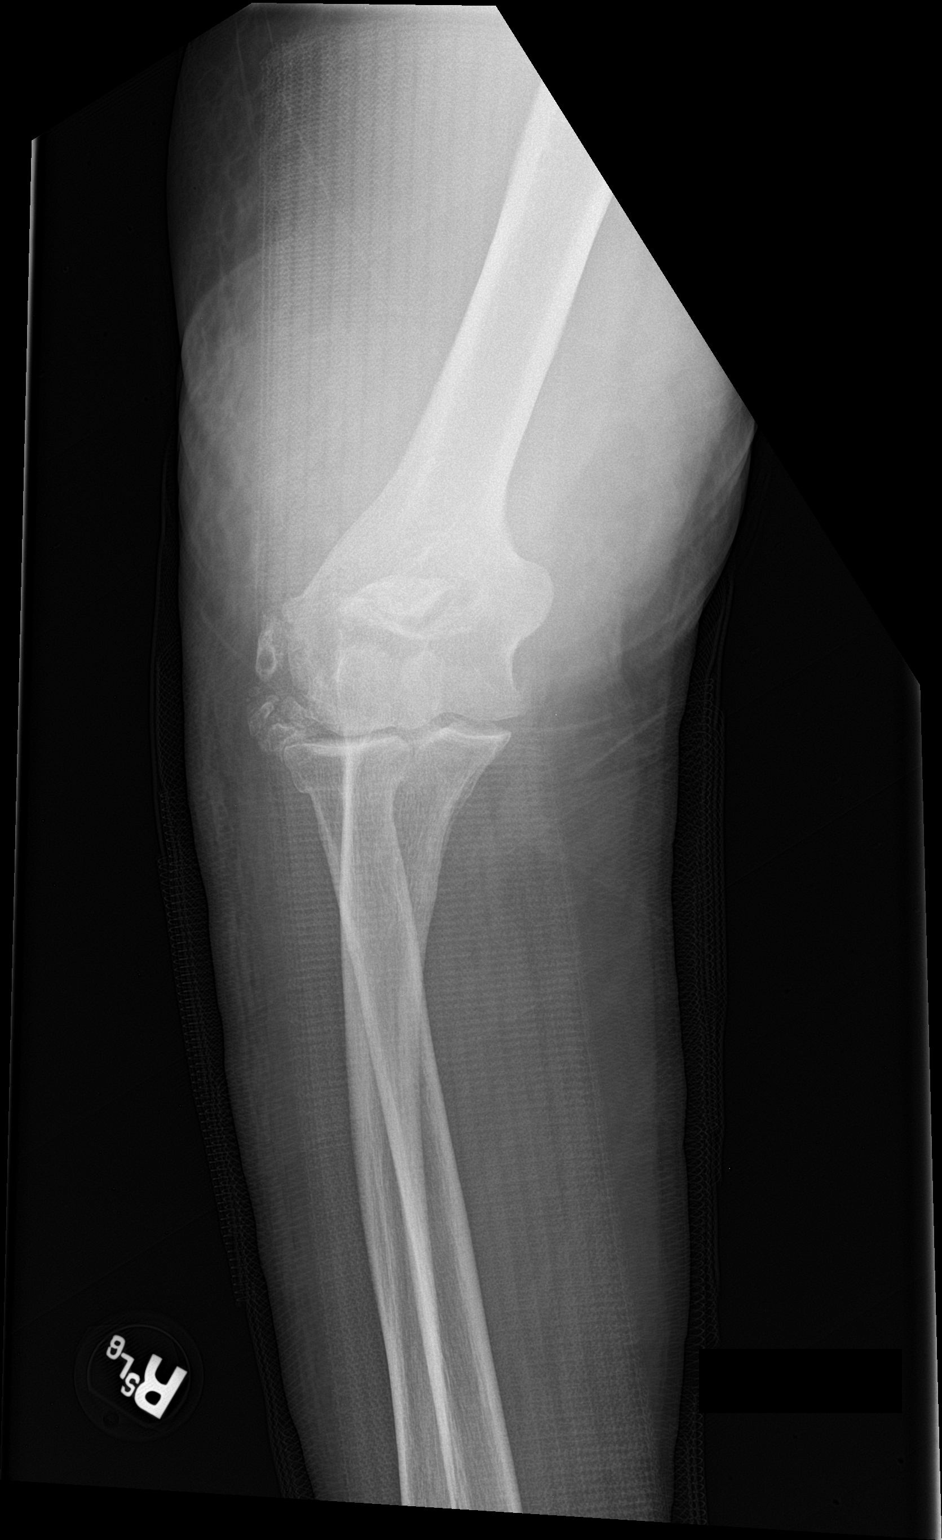

[elbow lat]
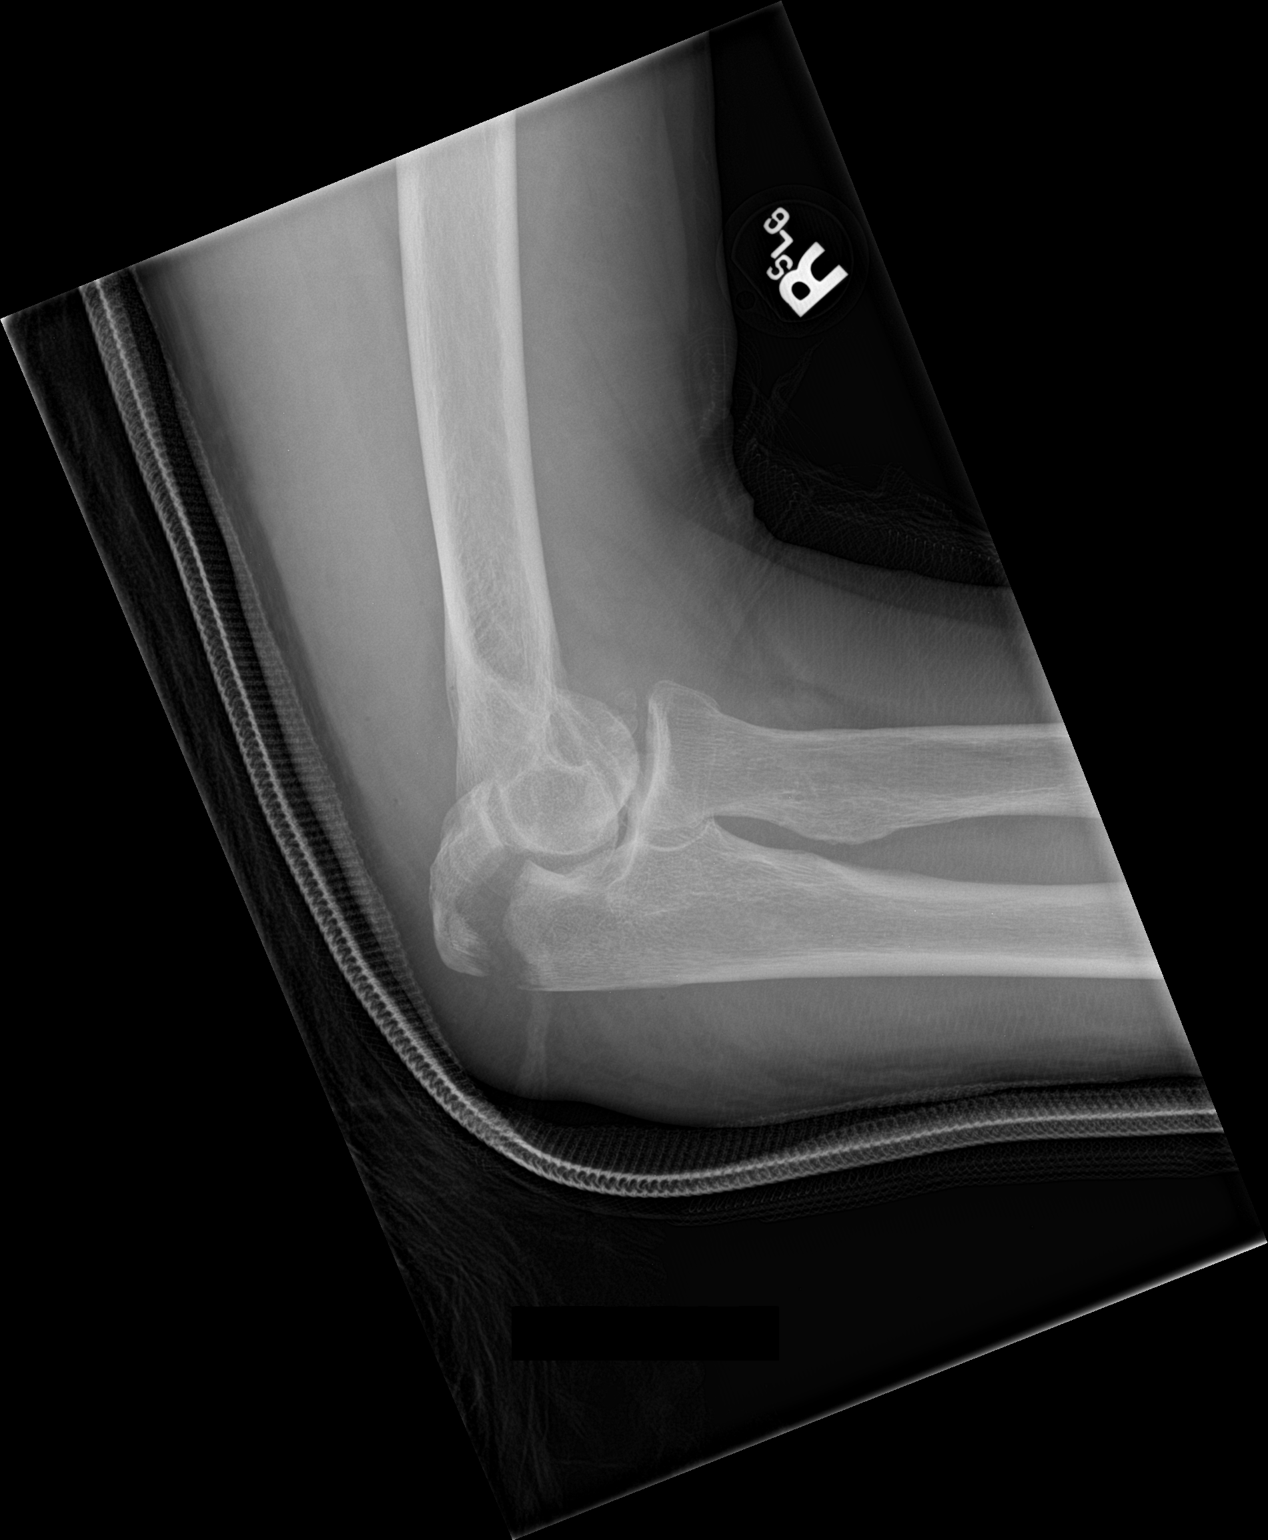

[elbow ap (2 of 2)]
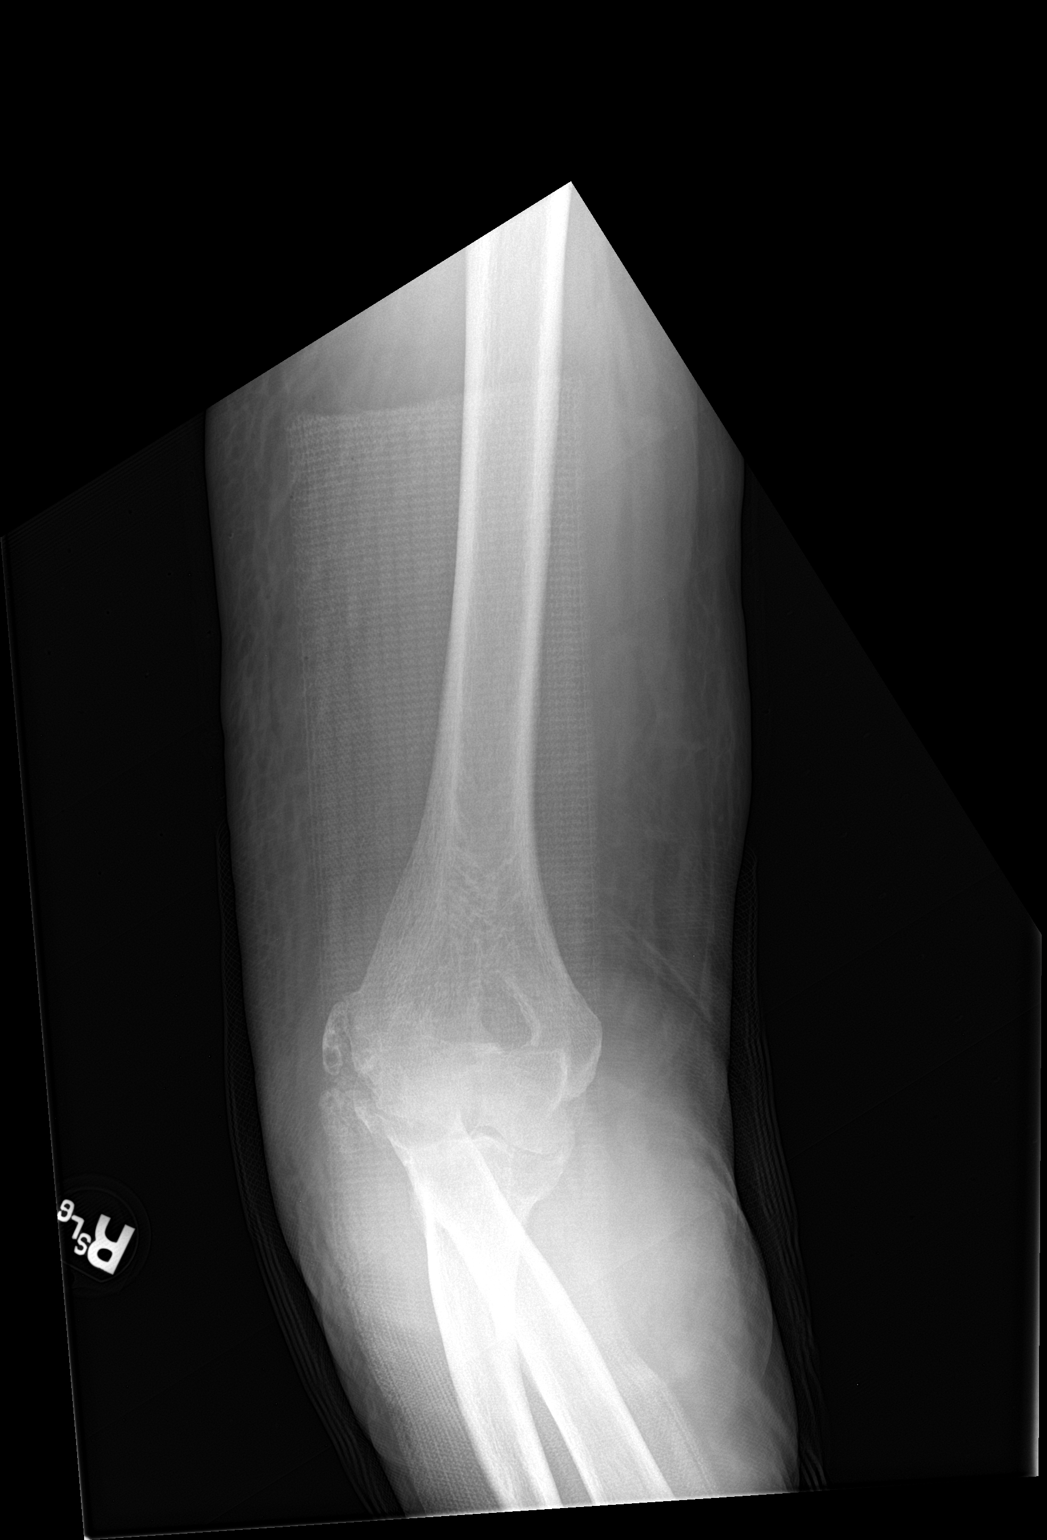

[3 of 3 positions shown; findings below may reference images not displayed]

FINDINGS: Patient is casted. Displaced fracture of the olecranon process is
present. Multiple corticated bony densities noted about the radial
aspect of the elbow, possibly old fracture fragments. Superimposed
acute fracture of the radial aspect of the distal humeral condyle
cannot be excluded. No dislocation. Probable elbow joint effusion.
IMPRESSION: 1. Displaced fracture of the olecranon process.  No dislocation.
2. Multiple corticated bony densities noted about the radial aspect
of the elbow possibly old fracture fragments. Superimposed acute
fracture of the radial aspect of the distal humeral condyle cannot
be excluded. Probable elbow joint effusion.

## 2021-11-11 ENCOUNTER — Other Ambulatory Visit: Payer: Self-pay | Admitting: *Deleted

## 2021-11-11 DIAGNOSIS — Z114 Encounter for screening for human immunodeficiency virus [HIV]: Secondary | ICD-10-CM

## 2021-11-11 DIAGNOSIS — B1920 Unspecified viral hepatitis C without hepatic coma: Secondary | ICD-10-CM

## 2021-11-11 NOTE — Telephone Encounter (Signed)
Spoke to pt, he informed me that he has not had blood work done yet. He stated that he is not happy, because he has had blood drawn previous and doesn't understand why we can't use the previous blood work. He stated he does not believe that he has Hep C, he said he feels fine. I explained that we needed up to date labs so the provider knows how to treat him. He stated he would try and go to [facility] lab next week when he gets back in town. I informed provider of this and faxed labs to [facility].   ?

## 2021-11-11 NOTE — Telephone Encounter (Signed)
Toni Amend, can you reach out to [facility] to see if patient had blood work completed? I have not received anything.  ?

## 2021-11-11 NOTE — Telephone Encounter (Signed)
LMOM for pt to call office back 

## 2021-11-13 DIAGNOSIS — R69 Illness, unspecified: Secondary | ICD-10-CM | POA: Diagnosis not present

## 2021-11-13 DIAGNOSIS — Z114 Encounter for screening for human immunodeficiency virus [HIV]: Secondary | ICD-10-CM | POA: Diagnosis not present

## 2021-11-14 ENCOUNTER — Encounter: Payer: Self-pay | Admitting: *Deleted

## 2021-11-16 LAB — TEST AUTHORIZATION

## 2021-11-16 LAB — COMPLETE METABOLIC PANEL WITH GFR
AG Ratio: 0.7 (calc) — ABNORMAL LOW (ref 1.0–2.5)
ALT: 76 U/L — ABNORMAL HIGH (ref 9–46)
AST: 142 U/L — ABNORMAL HIGH (ref 10–35)
Albumin: 3.4 g/dL — ABNORMAL LOW (ref 3.6–5.1)
Alkaline phosphatase (APISO): 107 U/L (ref 35–144)
BUN: 9 mg/dL (ref 7–25)
CO2: 29 mmol/L (ref 20–32)
Calcium: 9 mg/dL (ref 8.6–10.3)
Chloride: 103 mmol/L (ref 98–110)
Creat: 1 mg/dL (ref 0.70–1.35)
Globulin: 4.8 g/dL (calc) — ABNORMAL HIGH (ref 1.9–3.7)
Glucose, Bld: 100 mg/dL (ref 65–139)
Potassium: 4.4 mmol/L (ref 3.5–5.3)
Sodium: 135 mmol/L (ref 135–146)
Total Bilirubin: 0.4 mg/dL (ref 0.2–1.2)
Total Protein: 8.2 g/dL — ABNORMAL HIGH (ref 6.1–8.1)
eGFR: 86 mL/min/{1.73_m2} (ref >=60)

## 2021-11-16 LAB — HCV RNA,QUANTITATIVE REAL TIME PCR
HCV Quantitative Log: 5.79 Log IU/mL — ABNORMAL HIGH
HCV RNA, PCR, QN: 621000 IU/mL — ABNORMAL HIGH

## 2021-11-16 LAB — HEPATITIS C ANTIBODY
Hepatitis C Ab: REACTIVE — AB
SIGNAL TO CUT-OFF: >11 — ABNORMAL HIGH (ref <1.00)

## 2021-11-20 LAB — COLOGUARD: COLOGUARD: NEGATIVE

## 2021-11-27 ENCOUNTER — Ambulatory Visit (HOSPITAL_COMMUNITY)
Admission: RE | Admit: 2021-11-27 | Discharge: 2021-11-27 | Disposition: A | Discharge status: Home / Self Care | Payer: 59 | Source: Ambulatory Visit | Attending: Gastroenterology | Admitting: Gastroenterology

## 2021-11-27 DIAGNOSIS — B1920 Unspecified viral hepatitis C without hepatic coma: Secondary | ICD-10-CM | POA: Insufficient documentation

## 2021-11-29 ENCOUNTER — Ambulatory Visit: Payer: Self-pay | Admitting: Gastroenterology

## 2021-12-01 NOTE — Progress Notes (Signed)
? ? ?Referring Provider: [facility] ?Primary Care Physician:  [facility] ?Primary GI Physician: Dr. Abbey Chatters ? ?Chief Complaint  ?Patient presents with  ? Cirrhosis  ? ? ?HPI:   ?Alexander Wilkins is a 61 y.o. male presenting today for follow-up of chronic Hep C diagnosed December 2022, untreated, and recently diagnosed cirrhosis May 2023.  ? ?I last saw [name] for initial consult via video visit on 10/09/2021 for chronic hep C at the request of his PCP.  Unfortunately, no labs were included with his referral.  It was unknown how he contracted hepatitis C.  Denied illicit drug use, tattoos, blood transfusions, known exposures to hepatitis.  He was drinking a little wine every other night and reported he used to drink about a 12 pack of beer a week.  No symptoms of decompensated liver disease.  No significant GI symptoms.  He was overdue for colon cancer screening.  Plan to request labs from PCP and arrange colonoscopy. ? ?Patient canceled his colonoscopy but did complete a Cologuard which was negative. ? ?Received labs dated 07/16/2021.  AST and ALT were elevated at 172 and 132 respectively.  Platelets were low at 112.  Hepatitis C antibody was reactive with RNA 849,000.  Hepatitis B serologies and hepatitis A antibody total all nonreactive. (See lab section below for complete details). ? ?Additional blood work and ultrasound were ordered, only part of which has been completed in April 2023.  CMP with persistently elevated LFTs with AST 142, ALT 76.  Albumin low at 3.4.  HCVRNA quant 621,000.  CBC, INR, hepatitis C genotype, HIV not completed. ? ?Abdominal ultrasound 11/27/2021 with nodular liver contour suggesting cirrhosis.  Normal spleen. ? ?I recommended OV to discuss cirrhosis and to regroup as there has been some trouble with compliance. Would need to have in person visit to discuss cirrhosis and importance of compliance in order to proceed with Hep C treatment in  the future.  ? ?Today:  ?States he feels fine and it is hard to believe that anything is wrong. Asking to see the [place] pictures of his liver. States he is a little leery of the hepatitis C medication and is going to look it up. ? ?He has no GI symptoms.  No abdominal pain, constipation, diarrhea, BRBPR, melena, unintentional weight loss, nausea, vomiting, reflux symptoms, or dysphagia. ? ?Reports he has slacked off on his alcohol intake since he was told he has cirrhosis.  Difficult to get an understanding of how much he was drinking previously, but states he may drink 2 or 3 bourbon and water, a glass of wine, or beer each night.  States he would be able to discontinue alcohol use for hep C treatment. ? ?Not interested in EGD. ? ? ?Discussed non-compliance and not paying for treatment more than once.  ? ?Past Medical History:  ?Diagnosis Date  ? Arthritis   ? Chronic hepatitis C ([place])   ? Cirrhosis ([place])   ? ? ?Past Surgical History:  ?Procedure Laterality Date  ? KNEE SURGERY Right 1974  ? ORIF ELBOW FRACTURE Right 08/30/2020  ? Procedure: OPEN REDUCTION INTERNAL FIXATION (ORIF) ELBOW/OLECRANON FRACTURE;  Surgeon: Hessie Knows, MD;  Location: [facility] ORS;  Service: Orthopedics;  Laterality: Right;  ? WRIST SURGERY Left   ? ? ?Current Outpatient Medications  ?Medication Sig Dispense Refill  ? HYDROcodone-acetaminophen (NORCO) 10-325 MG tablet Take 1 tablet by mouth every 6 (six) hours as needed.    ? ibuprofen (  ADVIL) 600 MG tablet Take 600 mg by mouth every 6 (six) hours as needed.    ? meloxicam (MOBIC) 15 MG tablet Take 15 mg by mouth daily.    ? Multiple Vitamin (MULTIVITAMIN) tablet Take 1 tablet by mouth daily.    ? naproxen (NAPROSYN) 500 MG tablet Take 1 tablet (500 mg total) by mouth 2 (two) times daily with a meal. 14 tablet 0  ? colchicine 0.6 MG tablet Take 2 tabs immediately, then 1 tab twice per day for 7 days. (Patient not taking: Reported on 10/09/2021) 16 tablet 0  ? sildenafil (VIAGRA) 100 MG tablet  1/2-1 30 minutes before sex as needed (Patient not taking: Reported on 10/09/2021) 30 tablet prn  ? ?No current facility-administered medications for this visit.  ? ? ?Allergies as of 12/02/2021 - Review Complete 12/02/2021  ?Allergen Reaction Noted  ? Tramadol Hives 08/29/2020  ? ? ?Family History  ?Problem Relation Age of Onset  ? Colon cancer Neg Hx   ? ? ?Social History  ? ?Socioeconomic History  ? Marital status: Married  ?  Spouse name: Not on file  ? Number of children: Not on file  ? Years of education: Not on file  ? Highest education level: Not on file  ?Occupational History  ? Not on file  ?Tobacco Use  ? Smoking status: Former  ? Smokeless tobacco: Never  ?Substance and Sexual Activity  ? Alcohol use: Yes  ?  Comment: 2-3 bourbon and water, glass of wine, or beer each night  ? Drug use: No  ? Sexual activity: Not on file  ?Other Topics Concern  ? Not on file  ?Social History Narrative  ? Not on file  ? ?Social Determinants of Health  ? ?Financial Resource Strain: Not on file  ?Food Insecurity: Not on file  ?Transportation Needs: Not on file  ?Physical Activity: Not on file  ?Stress: Not on file  ?Social Connections: Not on file  ? ? ?Review of Systems: ?Gen: Denies fever, chills, cold or flu like symptoms, pre-syncope, or syncope.  ?CV: Denies chest pain or palpitations. ?Resp: Denies dyspnea or cough.  ?GI: See HPI ?Heme: See HPI ? ?Physical Exam: ?BP 122/64   Pulse 96   Temp 98 ?F (36.7 ?C) (Temporal)   Ht _0  (1.88 m)   Wt 216 lb 9.6 oz (98.2 kg)   BMI 27.81 kg/m?  ?General:   Alert and oriented. No distress noted. Pleasant and cooperative.  ?Head:  Normocephalic and atraumatic. ?Eyes:  Conjuctiva clear without scleral icterus. ?Heart:  S1, S2 present without murmurs appreciated. ?Lungs:  Clear to auscultation bilaterally. No wheezes, rales, or rhonchi. No distress.  ?Abdomen:  +BS, soft, non-tender and non-distended. No rebound or guarding. No HSM or masses noted. ?Msk:  Symmetrical without  gross deformities. Normal posture. ?Extremities:  Without edema. ?Neurologic:  Alert and  oriented x4 ?Psych:  Normal mood and affect. ? ? ?Labs:  ?07/16/2021 ?CMP: Glucose 112 (H), creatinine 0.74, sodium 134 (L), potassium 4.2, chloride 104, calcium 8.9, albumin 3.5 (L), total bilirubin 0.5, alk phos 107, AST 172 (H), ALT 132 (H). ?CBC: WBC 4.9, hemoglobin 14.1, hematocrit 39.3, MCV 101.6 (H), MCH 36.4 (H), MCHC 35.9, platelets 112 (L). ?Hemoglobin A1c 5.2 ?  ?07/25/2021 ?Hepatitis panel: ?Hepatitis A antibody total nonreactive, hepatitis B surface antibody nonreactive, hepatitis B surface antigen nonreactive, hepatitis B core antibody nonreactive, hepatitis C antibody reactive >11.  ?  ?Hepatitis C RNA 849,000. ?  ?Vitamin B12 4 3 ?Folate >  24 ? ?11/13/2021: ?CMP: Glucose 100, BUN 9, creatinine 1.0, sodium 135, potassium 4.4, chloride 103, calcium 9.0, total protein 8.2, albumin 3.4 (L), total bilirubin 0.4, alk phos 107, AST 142 (H), ALT 76 (H). ?Hepatitis C RNA quant: 621,000 ? ? ? ?Assessment:  ?61 year old male presenting today for follow-up of chronic hep C diagnosed December 2022, untreated, and recent diagnosis of cirrhosis in May 2023. ? ?Chronic hep C:  ?HCVRNA 621,000.  Genotype unknown.  Associated elevated LFTs and cirrhosis discussed below which is well compensated.  He is currently drinking alcohol nightly, but states he would be able to discontinue this at least for hep C treatment.  We have had some compliance issues thus far with getting labs completed and patient tells me today has a little bit leery about hepatitis C medications.  We discussed hep C treatment, the need for follow-up, and labs, as well as his prior noncompliance issues.  Patient states he would be able to follow-up and complete labs as needed, but he also tells me he is a little leery about the Hep C medication and wants to look this up. We discussed that we would need full commitment to 12 weeks of treatment if he is agreeable to  proceed as these medications are very expensive and insurance companies may not want to pay for the medication again especially if failure was due to noncompliance.  ? ?We also discussed Hep C precautions inclu

## 2021-12-02 ENCOUNTER — Encounter: Payer: Self-pay | Admitting: Gastroenterology

## 2021-12-02 ENCOUNTER — Ambulatory Visit (INDEPENDENT_AMBULATORY_CARE_PROVIDER_SITE_OTHER): Payer: 59 | Admitting: Gastroenterology

## 2021-12-02 VITALS — BP 122/64 | HR 96 | Temp 98.0°F | Ht 74.0 in | Wt 216.6 lb

## 2021-12-02 DIAGNOSIS — R7989 Other specified abnormal findings of blood chemistry: Secondary | ICD-10-CM | POA: Diagnosis not present

## 2021-12-02 DIAGNOSIS — K7469 Other cirrhosis of liver: Secondary | ICD-10-CM

## 2021-12-02 DIAGNOSIS — Z114 Encounter for screening for human immunodeficiency virus [HIV]: Secondary | ICD-10-CM | POA: Diagnosis not present

## 2021-12-02 DIAGNOSIS — B182 Chronic viral hepatitis C: Secondary | ICD-10-CM | POA: Diagnosis not present

## 2021-12-02 DIAGNOSIS — R69 Illness, unspecified: Secondary | ICD-10-CM | POA: Diagnosis not present

## 2021-12-02 NOTE — Patient Instructions (Addendum)
Have your labs completed at [facility].  This is in the two-story brick building across from [facility] emergency department. ?[facility] address: [address], [place], [place] [address] ? ?As we discussed, you need to be strictly abstinent from alcohol in order for [place] to treat you for hepatitis C.  We will see how you are doing when I call you with your lab results. ? ?Due to cirrhosis, you will need to remain abstinent from alcohol moving forward. ? ?Tylenol is okay to take as needed.  Do not take more than 2000 mg of Tylenol per 24 hours. ? ?The medication we are considering starting you on for hepatitis C is Epclusa.  ? ?Nutrition recommendations:  ?High-protein diet from a primarily plant-based diet. ?Avoid red meat.  No raw or undercooked meat, seafood, or shellfish. ?Low-fat/cholesterol/carbohydrate diet. ?Limit sodium to no more than 2000 mg/day including everything that you eat and drink. ?Recommend at least 30 minutes of aerobic and resistance exercise 3 days/week. ? ?Follow-up date to be determined.   ? ?As we discussed, if we start you on hepatitis C treatment, we will need to at least do a virtual visit in 4 weeks and you will need to have labs in 4 weeks after you initiate treatment.  You also have to have labs at the end of treatment. ? ?For ongoing monitoring of cirrhosis, we will need to see you in the office every 6 months. ? ?It was good to see you today!  ? ?Ermalinda Memos, PA-C ?[facility]] ? ?

## 2021-12-03 DIAGNOSIS — R69 Illness, unspecified: Secondary | ICD-10-CM | POA: Diagnosis not present

## 2021-12-03 DIAGNOSIS — Z114 Encounter for screening for human immunodeficiency virus [HIV]: Secondary | ICD-10-CM | POA: Diagnosis not present

## 2021-12-03 DIAGNOSIS — K7469 Other cirrhosis of liver: Secondary | ICD-10-CM | POA: Diagnosis not present

## 2021-12-03 DIAGNOSIS — B182 Chronic viral hepatitis C: Secondary | ICD-10-CM | POA: Diagnosis not present

## 2021-12-05 ENCOUNTER — Ambulatory Visit: Payer: Self-pay | Admitting: Gastroenterology

## 2021-12-07 LAB — IRON,TIBC AND FERRITIN PANEL
%SAT: 49 % (calc) — ABNORMAL HIGH (ref 20–48)
Ferritin: 757 ng/mL — ABNORMAL HIGH (ref 24–380)
Iron: 146 ug/dL (ref 50–180)
TIBC: 300 mcg/dL (calc) (ref 250–425)

## 2021-12-07 LAB — CBC WITH DIFFERENTIAL/PLATELET
Absolute Monocytes: 374 cells/uL (ref 200–950)
Basophils Absolute: 29 cells/uL (ref 0–200)
Basophils Relative: 0.8 %
Eosinophils Absolute: 223 cells/uL (ref 15–500)
Eosinophils Relative: 6.2 %
HCT: 36.5 % — ABNORMAL LOW (ref 38.5–50.0)
Hemoglobin: 12.7 g/dL — ABNORMAL LOW (ref 13.2–17.1)
Lymphs Abs: 2099 cells/uL (ref 850–3900)
MCH: 35.6 pg — ABNORMAL HIGH (ref 27.0–33.0)
MCHC: 34.8 g/dL (ref 32.0–36.0)
MCV: 102.2 fL — ABNORMAL HIGH (ref 80.0–100.0)
MPV: 13.3 fL — ABNORMAL HIGH (ref 7.5–12.5)
Monocytes Relative: 10.4 %
Neutro Abs: 875 cells/uL — ABNORMAL LOW (ref 1500–7800)
Neutrophils Relative %: 24.3 %
Platelets: 92 10*3/uL — ABNORMAL LOW (ref 140–400)
RBC: 3.57 10*6/uL — ABNORMAL LOW (ref 4.20–5.80)
RDW: 12.2 % (ref 11.0–15.0)
Total Lymphocyte: 58.3 %
WBC: 3.6 10*3/uL — ABNORMAL LOW (ref 3.8–10.8)

## 2021-12-07 LAB — TEST AUTHORIZATION

## 2021-12-07 LAB — B12 AND FOLATE PANEL
Folate: 13.7 ng/mL
Vitamin B-12: 290 pg/mL (ref 200–1100)

## 2021-12-07 LAB — HEPATITIS C GENOTYPE

## 2021-12-07 LAB — HIV ANTIBODY (ROUTINE TESTING W REFLEX): HIV 1&2 Ab, 4th Generation: NONREACTIVE

## 2021-12-07 LAB — AFP TUMOR MARKER: AFP-Tumor Marker: 20.2 ng/mL — ABNORMAL HIGH (ref <6.1)

## 2021-12-07 LAB — PROTIME-INR
INR: 1.1
Prothrombin Time: 11.6 s — ABNORMAL HIGH (ref 9.0–11.5)

## 2022-01-15 NOTE — Progress Notes (Unsigned)
Referring Provider: *** Primary Care Physician:  [facility]  Primary GI: ***  Patient Location: Home   Provider Location: [facility] office   Reason for Visit: ***   Persons present on the virtual encounter, with roles: Alexander Memos, PA-C (Provider), Alexander Wilkins (patient)   Total time (minutes) spent on medical discussion: ### minutes  Virtual Visit via *** Note Due to COVID-19, visit is conducted virtually and was requested by patient.   I connected withNAME@ on 01/15/22 at  8:30 AM EDT by *** and verified that I am speaking with the correct person using two identifiers.   I discussed the limitations, risks, security and privacy concerns of performing an evaluation and management service by *** and the availability of in person appointments. I also discussed with the patient that there may be a patient responsible charge related to this service. The patient expressed understanding and agreed to proceed.  No chief complaint on file.    History of Present Illness: Alexander Wilkins is a 61 y.o. male with chronic Hep C diagnosed December 2022, genotype 1a, untreated, cirrhosis diagnoses May 2023, presenting today for consideration of starting Hep C treatment.   Last seen in our office May 2023. He was having a hard time accepting cirrhosis diagnosis and wasn't sure if he wanted to take Hep C medications. Clinically, he was doing well without GI symptoms, or symptoms of decompensated liver disease. He was still drinking alcohol daily, but stated he was decreasing this and would be able to discontinue alcohol for Hep C treatment. We discussed what Hep C treatment entailed including medications, labs, and routine follow-up. Also discussed prior noncompliance issues and need for alcohol abstenance. Patient stated he would follow-up and complete labs as needed, but he wanted to look into the Hep C medications before deciding if he wanted to consider treatment.  For his cirrhosis, recommended EGD, but he declined. Planned to update routine labs.   Labs completed 5/9. Hemoglobin slightly low at 12.7 with macrocytic indices, platelets 92, INR 1.1. Ferritin elevated at 757, saturation 49%, iron 146. B12 low normal. Folate wnl. AFP elevated at 20.2. HIV non-reactive.  Suspected elevated ferritin and saturation was secondary to chronic Hep C, but would consider further evaluation if persistent elevation despite Hep C treatment. AFP also likely secondary to Hep C as recent US didn't show focal liver lesions. This was reviewed with Dr. Marletta Lor. Recommended monitoring for now.   Spoke with patient after labs completed. Reported he was working towards alcohol abstinence. Reported he wanted to get treated for Hep C. Recommended VV in 4 weeks to see how he is doing with abstinence from alcohol.  If he is doing well, we can plan to submit for Epclusa.   Today:    Past Medical History:  Diagnosis Date   Arthritis    Chronic hepatitis C (HCC)    Cirrhosis (HCC)      Past Surgical History:  Procedure Laterality Date   KNEE SURGERY Right 1974   ORIF ELBOW FRACTURE Right 08/30/2020   Procedure: OPEN REDUCTION INTERNAL FIXATION (ORIF) ELBOW/OLECRANON FRACTURE;  Surgeon: Kennedy Bucker, MD;  Location: [facility] ORS;  Service: Orthopedics;  Laterality: Right;   WRIST SURGERY Left      No outpatient medications have been marked as taking for the 01/17/22 encounter (Appointment) with Letta Median, PA-C.     Family History  Problem Relation Age of Onset   Colon cancer Neg Hx  Social History   Socioeconomic History   Marital status: Married    Spouse name: Not on file   Number of children: Not on file   Years of education: Not on file   Highest education level: Not on file  Occupational History   Not on file  Tobacco Use   Smoking status: Former   Smokeless tobacco: Never  Substance and Sexual Activity   Alcohol use: Yes    Comment: 2-3 bourbon and  water, glass of wine, or beer each night   Drug use: No   Sexual activity: Not on file  Other Topics Concern   Not on file  Social History Narrative   Not on file   Social Determinants of Health   Financial Resource Strain: Not on file  Food Insecurity: Not on file  Transportation Needs: Not on file  Physical Activity: Not on file  Stress: Not on file  Social Connections: Not on file       Review of Systems: Gen: Denies fever, chills, cold or flu like symptoms, pre-syncope, or syncope.  CV: Denies chest pain, palpitations. Resp: Denies dyspnea or cough.  GI: see HPI Heme: See HPI  Observations/Objective: No distress. Alert and oriented. Pleasant. Well nourished. Normal mood and affect. Unable to perform complete physical exam due to *** encounter. No video available. ***   Assessment:     Plan: ***     I discussed the assessment and treatment plan with the patient. The patient was provided an opportunity to ask questions and all were answered. The patient agreed with the plan and demonstrated an understanding of the instructions.   The patient was advised to call back or seek an in-person evaluation if the symptoms worsen or if the condition fails to improve as anticipated.  I provided *** minutes of non***-face-to-face time during this encounter.  Alexander Memos, PA-C [facility]  01/17/2022

## 2022-01-17 ENCOUNTER — Other Ambulatory Visit: Payer: Self-pay | Admitting: Gastroenterology

## 2022-01-17 ENCOUNTER — Telehealth: Payer: Self-pay | Admitting: Gastroenterology

## 2022-01-17 ENCOUNTER — Other Ambulatory Visit: Payer: Self-pay | Admitting: *Deleted

## 2022-01-17 ENCOUNTER — Encounter: Payer: Self-pay | Admitting: Gastroenterology

## 2022-01-17 ENCOUNTER — Telehealth (INDEPENDENT_AMBULATORY_CARE_PROVIDER_SITE_OTHER): Payer: 59 | Admitting: Gastroenterology

## 2022-01-17 VITALS — Ht 74.0 in | Wt 205.0 lb

## 2022-01-17 DIAGNOSIS — B182 Chronic viral hepatitis C: Secondary | ICD-10-CM

## 2022-01-17 DIAGNOSIS — K7469 Other cirrhosis of liver: Secondary | ICD-10-CM | POA: Diagnosis not present

## 2022-01-17 DIAGNOSIS — R69 Illness, unspecified: Secondary | ICD-10-CM | POA: Diagnosis not present

## 2022-01-17 DIAGNOSIS — R772 Abnormality of alphafetoprotein: Secondary | ICD-10-CM | POA: Diagnosis not present

## 2022-01-17 DIAGNOSIS — R7989 Other specified abnormal findings of blood chemistry: Secondary | ICD-10-CM

## 2022-01-17 MED ORDER — SOFOSBUVIR-VELPATASVIR 400-100 MG PO TABS: 1.0000 | ORAL_TABLET | Freq: Every day | ORAL | 2 refills | Status: DC

## 2022-01-17 NOTE — Telephone Encounter (Signed)
Noted. Please send E script to [facility] that is on file.

## 2022-01-24 ENCOUNTER — Other Ambulatory Visit: Payer: Self-pay

## 2022-01-24 DIAGNOSIS — B182 Chronic viral hepatitis C: Secondary | ICD-10-CM

## 2022-01-24 MED ORDER — SOFOSBUVIR-VELPATASVIR 400-100 MG PO TABS: 1.0000 | ORAL_TABLET | Freq: Every day | ORAL | 2 refills | Status: DC

## 2022-01-29 NOTE — Telephone Encounter (Signed)
Has Alexander Wilkins been approved?

## 2022-01-29 NOTE — Telephone Encounter (Signed)
Great! Please keep me updated as to when we receive the medication.

## 2022-01-29 NOTE — Telephone Encounter (Signed)
Courtney, can we follow-up on this?  ?

## 2022-01-29 NOTE — Telephone Encounter (Signed)
Called [facility], informed them to send medications to the office.

## 2022-01-29 NOTE — Telephone Encounter (Signed)
Received approval letter for Epclusa. Copy sent to the scan center.

## 2022-01-29 NOTE — Telephone Encounter (Signed)
Noted  

## 2022-02-05 NOTE — Telephone Encounter (Signed)
Great news.

## 2022-02-05 NOTE — Telephone Encounter (Signed)
Spoke with [facility] and was able to get the patient's copay down to $5 using Epclusa copay card. [facility] will be reaching out to patient to scheduled delivery. Spoke with pt and he is aware that medication is to be delivered to the office and to make sure they do not deliver it on a Friday. Pt verbalized understanding.

## 2022-02-11 NOTE — Telephone Encounter (Signed)
Called [facility], spoke to [place], she informed me that pt needs to call and speak to pharmacist for pt counseling on the medication, before it can be released. Called and spoke to pt and informed [facility]. Pt voiced understanding.

## 2022-02-11 NOTE — Telephone Encounter (Signed)
Noted  

## 2022-02-11 NOTE — Telephone Encounter (Signed)
Have we received Epclusa yet?

## 2022-02-12 ENCOUNTER — Telehealth: Payer: Self-pay | Admitting: *Deleted

## 2022-02-12 NOTE — Telephone Encounter (Signed)
Spoke to pt, he informed me that he has not had time to call [facility] yet. But, will try today. I informed him that he  needed to call today she they can ship his medication. Pt voiced understanding.

## 2022-02-12 NOTE — Telephone Encounter (Signed)
Noted  

## 2022-02-13 ENCOUNTER — Telehealth: Payer: Self-pay | Admitting: *Deleted

## 2022-02-13 NOTE — Telephone Encounter (Signed)
LMOM for pt to call office concerning his medication.

## 2022-02-17 ENCOUNTER — Telehealth: Payer: Self-pay | Admitting: *Deleted

## 2022-02-17 NOTE — Telephone Encounter (Signed)
Spoke to pt, informed him to call pharmacist about Epclusa. Pt voiced understanding. Pt stated he read bad reviews about the medication. I informed him to talk to the pharmacist about his concerns and to call me back after he spoke with them.

## 2022-02-17 NOTE — Telephone Encounter (Signed)
Spoke to [place] from [employer] and she informed me that pt's Alexander Wilkins will me delivered to the office on July 27. I voiced understanding and will inform the pt when medication is received.

## 2022-02-17 NOTE — Telephone Encounter (Signed)
Spoke with patient. Discussed his concerns. Patient states that he does want to proceed with treatment and will call so Epclusa can be sent to our office. Please follow-up on this in the next few days.

## 2022-02-20 NOTE — Telephone Encounter (Signed)
Received Epclusa today. Will notify pt that medication is here to be picked up.

## 2022-02-20 NOTE — Telephone Encounter (Signed)
Informed pt medication was at office. Pt stated he will be here tomorrow 7/28 to pick up medication.

## 2022-02-20 NOTE — Telephone Encounter (Signed)
Please let me know when he picks medication up so we can arrange labs. He will need to complete CMP, INR, HCV RNA after 4 weeks of treatment. Dx: Cirrhosis, Chronic Hep C.

## 2022-02-20 NOTE — Telephone Encounter (Signed)
Need pt's instructions please.

## 2022-02-20 NOTE — Telephone Encounter (Signed)
Epclusa for Hepatitis C Instructions:   Take Epclusa, one tablet daily with or without food. You should take it at approximately the same time every day.  Do not miss a dose. You will start on _________ and end on ________ (12 weeks of treatment).   Do not run out of Epclusa! If you are down to one week of medication left and have not heard about your next shipment, please let us know as soon as possible.   If you need to start a new medication, prescription from your doctor or over the counter medication, you need to contact us to make sure it does not interfere with Epclusa. There are several medications that can interfere with Epclusa and can make you sick or make the medication not work.  If you need to take a medication for acid reflux, your choices are as follows: TUMS or Rolaids separated at least four hours from [place].  Pepcid (famotidine) 40mg  up to twice per day administered with Epclusa or 12 hours apart from .    Tylenol (acetominophen) and Advil (ibuprofen) are safe to take with Epclusa if needed for headache, fever, pain.   You will have and appointment and blood work done after 4 weeks of treatment to make sure you are tolerating Epclusa and to see if the medication is getting rid of the Hepatitis C.   DO NOT stop Epclusa unless instructed to by your provider.  You will also have blood work done at the end of treatment and 12-24 weeks after end of treatment.        Patient signature: ____________________  Date: _______________    Please fill in start and end dates once patient picks up medications.

## 2022-02-21 NOTE — Telephone Encounter (Signed)
Fantastic. Please make sure we contact him in 4 weeks about having labs completed.

## 2022-02-21 NOTE — Telephone Encounter (Signed)
Pt came and picked up Epclusa. Given instructions on how to take medications. Signed and made copies of instructions and medication list. Informed to call office with any questions or concerns.

## 2022-02-21 NOTE — Telephone Encounter (Signed)
Noted  

## 2022-03-27 NOTE — Telephone Encounter (Signed)
Can we follow-up with patient on his Epclusa? Is he taking it every day? Any concerns? Have we received a second shipment of the medication? He should also be due for his 4 week labs- CMP, INR, Hep C RNA. Please arrange. Dx: Chronic Hep C.

## 2022-04-01 ENCOUNTER — Other Ambulatory Visit: Payer: Self-pay | Admitting: *Deleted

## 2022-04-01 DIAGNOSIS — B182 Chronic viral hepatitis C: Secondary | ICD-10-CM

## 2022-04-01 NOTE — Telephone Encounter (Signed)
LMOM for pt to call office. Labs entered into Epic. Mailed lab requisitions.

## 2022-04-04 ENCOUNTER — Telehealth (INDEPENDENT_AMBULATORY_CARE_PROVIDER_SITE_OTHER): Payer: Self-pay | Admitting: *Deleted

## 2022-04-04 NOTE — Telephone Encounter (Signed)
Patient called here asking how he gets refills of epculsa. He said he has taken for one month and almost out of med. He thought he picked up from [facility] last time.  [phone]

## 2022-04-06 NOTE — Telephone Encounter (Signed)
Toni Amend, can you please try reaching patient again this week to follow-up with him?

## 2022-04-07 NOTE — Telephone Encounter (Signed)
Noted. Thank you. I called him.

## 2022-04-07 NOTE — Telephone Encounter (Signed)
LMOM for pt to call office  

## 2022-04-07 NOTE — Telephone Encounter (Signed)
Noted  

## 2022-04-07 NOTE — Telephone Encounter (Signed)
Spoke to pt, informed him that Dorita Fray will be delivered on 9/14 afternoon and he can come by office and pick it up. Pt states he is doing fine on the medication. He states he takes it with dinner and has had no side effects. Also informed him to have labs completed when he comes to get medications .

## 2022-04-10 ENCOUNTER — Telehealth: Payer: Self-pay | Admitting: *Deleted

## 2022-04-10 NOTE — Telephone Encounter (Signed)
Noted  

## 2022-04-10 NOTE — Telephone Encounter (Signed)
Spoke to pt, informed him that his Alexander Wilkins was here at the office and please come pick up. Also, reminded him to have labs done.

## 2022-05-08 NOTE — Telephone Encounter (Signed)
Alexander Wilkins, it has been 4 weeks since we last heard from patient. He should be needing his last shipment of Epclusa. Please check in with him to ensure he is still taking Epclusa and ensure that we get his last shipment of medication to him.   He also needs to have labs completed. Doesn't look like this was ever done.

## 2022-05-08 NOTE — Telephone Encounter (Signed)
Noted. Lets try to call him back on Monday. We need to make sure he gets his next refill before he runs out of medications. Only so much we can do on our end however. Thank you for reaching out today.

## 2022-05-08 NOTE — Telephone Encounter (Signed)
Spoke to pt, he informed me that he was out of town at the moment. He states he is taking his medication daily and will have labs completed when he returns. I asked how long will he going to be out of town and he hung up on me.

## 2022-05-12 NOTE — Telephone Encounter (Signed)
LMOM for pt to call office  

## 2022-05-14 ENCOUNTER — Telehealth: Payer: Self-pay | Admitting: *Deleted

## 2022-05-14 NOTE — Telephone Encounter (Signed)
error 

## 2022-05-14 NOTE — Telephone Encounter (Signed)
Noted. I am not sure how he has so many pills left unless he did not start his medication the day he picked up his first prescription. Regardless, he needs to be sure he isn't missing any days and we complete the last 4 weeks of treatment.   Spoke with [name], CMA who stated patient already told her that he had only missed taking 1 day of his medication. He also stated he wasn't going to have blood work competed until the end of treatment because it was a Theatre manager of money".   Would like to plan for in person office visit in 6-8 weeks. He should have completed treatment at that point along with labs, and will also be due for routine cirrhosis follow-up.   Manuela Schwartz:  Please arrange in-person OV with available APP for Hep C and cirrhosis follow-up in 6-8 weeks.

## 2022-05-14 NOTE — Telephone Encounter (Signed)
Spoke to pt, informed him that he has 1 more treatment of Epclusa and then he will be done. He informed me that he still has 10 pills left. I will call [facility] and have the last shipment of Epclusa sent to the office.

## 2022-05-16 NOTE — Telephone Encounter (Signed)
Received approval letter for Epclusa. Called and set up deliver for Monday 05/19/22. Will come to the office. Called and left message for pt.

## 2022-05-20 ENCOUNTER — Telehealth: Payer: Self-pay | Admitting: *Deleted

## 2022-05-20 NOTE — Telephone Encounter (Signed)
Noted  

## 2022-05-20 NOTE — Telephone Encounter (Signed)
Pt's Alexander Wilkins was delivered today. Informed pt to come pick up medications. Pt voiced understanding.

## 2022-05-21 ENCOUNTER — Telehealth: Payer: Self-pay | Admitting: *Deleted

## 2022-05-21 DIAGNOSIS — R69 Illness, unspecified: Secondary | ICD-10-CM | POA: Diagnosis not present

## 2022-05-21 DIAGNOSIS — R351 Nocturia: Secondary | ICD-10-CM | POA: Diagnosis not present

## 2022-05-21 DIAGNOSIS — Z6827 Body mass index (BMI) 27.0-27.9, adult: Secondary | ICD-10-CM | POA: Diagnosis not present

## 2022-05-21 DIAGNOSIS — M79672 Pain in left foot: Secondary | ICD-10-CM | POA: Diagnosis not present

## 2022-05-21 NOTE — Telephone Encounter (Signed)
Noted. I suspect he has missed more days that he is admitting to, but regardless, it is important that we complete his treatment as best we can at this point.

## 2022-05-21 NOTE — Telephone Encounter (Signed)
Spoke to pt, he informed me that he will be by the office one day next week, because he still has about 8 pills left. He states he was sick this week.

## 2022-05-28 NOTE — Telephone Encounter (Signed)
Noted  

## 2022-05-28 NOTE — Telephone Encounter (Signed)
Tried pt several times. No answer. Mailbox is full.

## 2022-05-28 NOTE — Telephone Encounter (Signed)
Alexander Wilkins, can you reach back out to patient this week if he hasn't already come by to pick up his medication?

## 2022-05-30 ENCOUNTER — Encounter: Payer: Self-pay | Admitting: *Deleted

## 2022-05-30 NOTE — Telephone Encounter (Signed)
Tried pt several times this week. No answer and mailbox is full. I will mail a letter.

## 2022-06-05 ENCOUNTER — Telehealth: Payer: Self-pay | Admitting: *Deleted

## 2022-06-05 NOTE — Telephone Encounter (Signed)
FYI routing to you in absence of [place], [place]. I have tried pt several times, to informed him that his medication is here and he needs to come and pick it up. He has not responded to my calls or the letter I mailed him.  He has an OV with you on 11/20.

## 2022-06-16 ENCOUNTER — Ambulatory Visit: Payer: 59 | Admitting: Gastroenterology

## 2022-07-04 DIAGNOSIS — J069 Acute upper respiratory infection, unspecified: Secondary | ICD-10-CM | POA: Diagnosis not present

## 2022-07-04 DIAGNOSIS — Z1389 Encounter for screening for other disorder: Secondary | ICD-10-CM | POA: Diagnosis not present

## 2022-07-09 DIAGNOSIS — J329 Chronic sinusitis, unspecified: Secondary | ICD-10-CM | POA: Diagnosis not present

## 2022-07-09 DIAGNOSIS — J029 Acute pharyngitis, unspecified: Secondary | ICD-10-CM | POA: Diagnosis not present

## 2022-07-09 DIAGNOSIS — R059 Cough, unspecified: Secondary | ICD-10-CM | POA: Diagnosis not present

## 2022-07-09 DIAGNOSIS — J069 Acute upper respiratory infection, unspecified: Secondary | ICD-10-CM | POA: Diagnosis not present

## 2022-07-24 DIAGNOSIS — R300 Dysuria: Secondary | ICD-10-CM | POA: Diagnosis not present

## 2022-07-24 DIAGNOSIS — R69 Illness, unspecified: Secondary | ICD-10-CM | POA: Diagnosis not present

## 2022-07-24 DIAGNOSIS — D519 Vitamin B12 deficiency anemia, unspecified: Secondary | ICD-10-CM | POA: Diagnosis not present

## 2022-07-29 ENCOUNTER — Telehealth: Payer: Self-pay | Admitting: *Deleted

## 2022-07-29 NOTE — Telephone Encounter (Signed)
Called and tried to leave a message for pt. Mail box is full.

## 2022-08-05 ENCOUNTER — Telehealth: Payer: Self-pay | Admitting: *Deleted

## 2022-08-05 ENCOUNTER — Encounter: Payer: Self-pay | Admitting: *Deleted

## 2022-08-05 NOTE — Telephone Encounter (Signed)
Noted.  Mailed a letter. 

## 2022-08-05 NOTE — Telephone Encounter (Signed)
FYI routing to you in [place], [place] absences. Tried several times to reach pt. No response from pt. Last dose of Epclusa still in lock box.

## 2022-08-05 NOTE — Telephone Encounter (Signed)
Please send patient a letter. Saving for [employer].

## 2022-08-13 ENCOUNTER — Telehealth: Payer: Self-pay | Admitting: *Deleted

## 2022-08-13 NOTE — Telephone Encounter (Signed)
I have placed pt's recent labs in your box.

## 2022-08-13 NOTE — Telephone Encounter (Signed)
FYI routing to you in absence of [place], [place]. Pt called and states he received the letter I sent him. He states he had blood work done at his PCP office and wants to know if we received them. I informed him that there has not been any labs sent to our office. 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 I informed him that he needs an OV to discuss his labs. He wants to know if his Hep C is gone. I  said again  he needs an OV to discuss that. He states he feels fine and his skin or eyes are not yellow. States he is eating a lot of salmon and taking Omega 3 and that is making him better. I will call PCP and try to get his resent labs.

## 2022-08-13 NOTE — Telephone Encounter (Signed)
Patient called back to say that [facility] won't fax the results from blood work said he has to come there and sign something.  They will send if we request them .... He asked if we could do that.

## 2022-08-13 NOTE — Telephone Encounter (Signed)
Alexander Wilkins confirmed that his last bottle of Alexander Wilkins is still in our lock box and was never picked up. Started Alexander Wilkins 01/2022. Compliance has been an issue, difficulty reaching patient. Patient called in today with questions about his labs that [name] previously ordered, he apparently finally had them done with his PCP (these are his first labs since starting Epclusa in 01/2022).    Labs dated 07/24/2022: Glucose 86, BUN 10, creatinine 0.78, sodium 136, albumin 4, total bilirubin 0.7, alkaline phosphatase 77, AST 23, ALT 11, HCVRNA less than 15.  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    This patient needs an OV, multiple things to follow up on. He also did not complete HCV treatment and is at risk for relapse.   Please schedule OV with first available APP.

## 2022-08-13 NOTE — Telephone Encounter (Signed)
See other telephone note.  

## 2022-08-15 NOTE — Telephone Encounter (Signed)
Alexander Wilkins, he is on your schedule.

## 2022-08-15 NOTE — Telephone Encounter (Signed)
Noted  

## 2022-08-25 ENCOUNTER — Ambulatory Visit: Payer: 59 | Admitting: Gastroenterology

## 2022-09-09 NOTE — Telephone Encounter (Signed)
Patient cancelled his appointment.   Leafy Ro, can you reach patient to see if he wants to reschedule his follow-up?

## 2022-09-15 NOTE — Telephone Encounter (Signed)
Noted! Thank you

## 2022-09-16 ENCOUNTER — Telehealth: Payer: Self-pay | Admitting: *Deleted

## 2022-09-16 NOTE — Telephone Encounter (Signed)
Noted. Informed pt of recommendations.  

## 2022-09-16 NOTE — Telephone Encounter (Signed)
I am glad he is feeling well. He does have cirrhosis however and should be followed by GI at least every 6 months. It is up to him if he follows these recommendations. Please make sure he is aware of this.

## 2022-09-16 NOTE — Telephone Encounter (Signed)
Pt called and states someone called him yesterday. I informed him that it was to set up an OV. He states he is fine. He went to his PCP early this month and she told him everything was fine. He states he is in no pain and feels good. States he will call when he needs an OV.

## 2022-09-23 ENCOUNTER — Encounter (INDEPENDENT_AMBULATORY_CARE_PROVIDER_SITE_OTHER): Payer: Self-pay | Admitting: *Deleted

## 2022-09-25 ENCOUNTER — Encounter: Payer: Self-pay | Admitting: Radiology

## 2022-10-10 ENCOUNTER — Encounter: Payer: Self-pay | Admitting: Emergency Medicine

## 2022-10-10 ENCOUNTER — Observation Stay: Payer: 59 | Admitting: General Practice

## 2022-10-10 ENCOUNTER — Other Ambulatory Visit: Payer: Self-pay

## 2022-10-10 ENCOUNTER — Telehealth: Payer: Self-pay | Admitting: *Deleted

## 2022-10-10 ENCOUNTER — Inpatient Hospital Stay
Admission: EM | Admit: 2022-10-10 | Discharge: 2022-10-12 | DRG: 378 | Disposition: A | Discharge status: Home / Self Care | Payer: 59 | Attending: Internal Medicine | Admitting: Internal Medicine

## 2022-10-10 ENCOUNTER — Encounter
Admission: EM | Disposition: A | Discharge status: Home / Self Care | Payer: Self-pay | Source: Home / Self Care | Attending: Internal Medicine

## 2022-10-10 DIAGNOSIS — K640 First degree hemorrhoids: Secondary | ICD-10-CM | POA: Diagnosis present

## 2022-10-10 DIAGNOSIS — K7469 Other cirrhosis of liver: Secondary | ICD-10-CM | POA: Diagnosis present

## 2022-10-10 DIAGNOSIS — M1990 Unspecified osteoarthritis, unspecified site: Secondary | ICD-10-CM | POA: Diagnosis present

## 2022-10-10 DIAGNOSIS — D62 Acute posthemorrhagic anemia: Secondary | ICD-10-CM

## 2022-10-10 DIAGNOSIS — K922 Gastrointestinal hemorrhage, unspecified: Secondary | ICD-10-CM | POA: Diagnosis not present

## 2022-10-10 DIAGNOSIS — Z8619 Personal history of other infectious and parasitic diseases: Secondary | ICD-10-CM

## 2022-10-10 DIAGNOSIS — K7460 Unspecified cirrhosis of liver: Secondary | ICD-10-CM

## 2022-10-10 DIAGNOSIS — K921 Melena: Secondary | ICD-10-CM | POA: Diagnosis not present

## 2022-10-10 DIAGNOSIS — G8929 Other chronic pain: Secondary | ICD-10-CM | POA: Insufficient documentation

## 2022-10-10 DIAGNOSIS — K2981 Duodenitis with bleeding: Principal | ICD-10-CM | POA: Diagnosis present

## 2022-10-10 DIAGNOSIS — Z888 Allergy status to other drugs, medicaments and biological substances status: Secondary | ICD-10-CM

## 2022-10-10 DIAGNOSIS — K319 Disease of stomach and duodenum, unspecified: Secondary | ICD-10-CM | POA: Diagnosis present

## 2022-10-10 DIAGNOSIS — G894 Chronic pain syndrome: Secondary | ICD-10-CM | POA: Diagnosis present

## 2022-10-10 DIAGNOSIS — Z885 Allergy status to narcotic agent status: Secondary | ICD-10-CM

## 2022-10-10 DIAGNOSIS — K5730 Diverticulosis of large intestine without perforation or abscess without bleeding: Secondary | ICD-10-CM | POA: Diagnosis present

## 2022-10-10 DIAGNOSIS — Z79899 Other long term (current) drug therapy: Secondary | ICD-10-CM

## 2022-10-10 DIAGNOSIS — F1020 Alcohol dependence, uncomplicated: Secondary | ICD-10-CM | POA: Diagnosis present

## 2022-10-10 DIAGNOSIS — K56699 Other intestinal obstruction unspecified as to partial versus complete obstruction: Secondary | ICD-10-CM

## 2022-10-10 DIAGNOSIS — K2971 Gastritis, unspecified, with bleeding: Secondary | ICD-10-CM | POA: Diagnosis present

## 2022-10-10 DIAGNOSIS — F1010 Alcohol abuse, uncomplicated: Secondary | ICD-10-CM | POA: Diagnosis not present

## 2022-10-10 DIAGNOSIS — B182 Chronic viral hepatitis C: Secondary | ICD-10-CM | POA: Diagnosis present

## 2022-10-10 DIAGNOSIS — Z87891 Personal history of nicotine dependence: Secondary | ICD-10-CM

## 2022-10-10 HISTORY — PX: ESOPHAGOGASTRODUODENOSCOPY (EGD) WITH PROPOFOL: SHX5813

## 2022-10-10 LAB — COMPREHENSIVE METABOLIC PANEL
ALT: 12 U/L (ref 0–44)
AST: 28 U/L (ref 15–41)
Albumin: 3.8 g/dL (ref 3.5–5.0)
Alkaline Phosphatase: 63 U/L (ref 38–126)
Anion gap: 8 (ref 5–15)
BUN: 15 mg/dL (ref 8–23)
CO2: 25 mmol/L (ref 22–32)
Calcium: 9.2 mg/dL (ref 8.9–10.3)
Chloride: 104 mmol/L (ref 98–111)
Creatinine, Ser: 0.75 mg/dL (ref 0.61–1.24)
GFR, Estimated: >60 mL/min (ref >60)
Glucose, Bld: 102 mg/dL — ABNORMAL HIGH (ref 70–99)
Potassium: 4.5 mmol/L (ref 3.5–5.1)
Sodium: 137 mmol/L (ref 135–145)
Total Bilirubin: 0.7 mg/dL (ref 0.3–1.2)
Total Protein: 8.3 g/dL — ABNORMAL HIGH (ref 6.5–8.1)

## 2022-10-10 LAB — TYPE AND SCREEN
ABO/RH(D): B POS
Antibody Screen: NEGATIVE

## 2022-10-10 LAB — HEMOGLOBIN AND HEMATOCRIT, BLOOD
HCT: 24.5 % — ABNORMAL LOW (ref 39.0–52.0)
Hemoglobin: 8.2 g/dL — ABNORMAL LOW (ref 13.0–17.0)

## 2022-10-10 LAB — CBC
HCT: 29.5 % — ABNORMAL LOW (ref 39.0–52.0)
Hemoglobin: 10 g/dL — ABNORMAL LOW (ref 13.0–17.0)
MCH: 35.6 pg — ABNORMAL HIGH (ref 26.0–34.0)
MCHC: 33.9 g/dL (ref 30.0–36.0)
MCV: 105 fL — ABNORMAL HIGH (ref 80.0–100.0)
Platelets: 169 10*3/uL (ref 150–400)
RBC: 2.81 MIL/uL — ABNORMAL LOW (ref 4.22–5.81)
RDW: 13.8 % (ref 11.5–15.5)
WBC: 6.8 10*3/uL (ref 4.0–10.5)
nRBC: 0 % (ref 0.0–0.2)

## 2022-10-10 LAB — PROTIME-INR
INR: 1.1 (ref 0.8–1.2)
Prothrombin Time: 14.5 seconds (ref 11.4–15.2)

## 2022-10-10 SURGERY — ESOPHAGOGASTRODUODENOSCOPY (EGD) WITH PROPOFOL
Anesthesia: General

## 2022-10-10 MED ORDER — SODIUM CHLORIDE 0.9 % IV SOLN
1.0000 g | INTRAVENOUS | Status: DC
  Administered 2022-10-11: 1 g via INTRAVENOUS
  Filled 2022-10-10 (×2): qty 10

## 2022-10-10 MED ORDER — PROPOFOL 500 MG/50ML IV EMUL
INTRAVENOUS | Status: DC | PRN
  Administered 2022-10-10: 150 ug/kg/min via INTRAVENOUS

## 2022-10-10 MED ORDER — PHENYLEPHRINE HCL (PRESSORS) 10 MG/ML IV SOLN
INTRAVENOUS | Status: DC | PRN
  Administered 2022-10-10: 80 ug via INTRAVENOUS

## 2022-10-10 MED ORDER — PANTOPRAZOLE INFUSION (NEW) - SIMPLE MED
8.0000 mg/h | INTRAVENOUS | Status: DC
  Administered 2022-10-10 – 2022-10-11 (×2): 8 mg/h via INTRAVENOUS
  Filled 2022-10-10 (×2): qty 100

## 2022-10-10 MED ORDER — SODIUM CHLORIDE 0.9 % IV SOLN: INTRAVENOUS | Status: DC

## 2022-10-10 MED ORDER — PEG 3350-KCL-NA BICARB-NACL 420 G PO SOLR: 2000.0000 mL | Freq: Once | ORAL | Status: DC | PRN

## 2022-10-10 MED ORDER — FOLIC ACID 1 MG PO TABS
1.0000 mg | ORAL_TABLET | Freq: Every day | ORAL | Status: DC
  Administered 2022-10-10 – 2022-10-11 (×2): 1 mg via ORAL
  Filled 2022-10-10 (×2): qty 1

## 2022-10-10 MED ORDER — ONDANSETRON HCL 4 MG/2ML IJ SOLN: 4.0000 mg | Freq: Four times a day (QID) | INTRAMUSCULAR | Status: DC | PRN

## 2022-10-10 MED ORDER — SODIUM CHLORIDE 0.9 % IV SOLN
50.0000 ug/h | INTRAVENOUS | Status: DC
  Administered 2022-10-10 – 2022-10-11 (×2): 50 ug/h via INTRAVENOUS
  Filled 2022-10-10 (×3): qty 1

## 2022-10-10 MED ORDER — THIAMINE HCL 100 MG/ML IJ SOLN: 100.0000 mg | Freq: Every day | INTRAMUSCULAR | Status: DC

## 2022-10-10 MED ORDER — PROPOFOL 10 MG/ML IV BOLUS
INTRAVENOUS | Status: DC | PRN
  Administered 2022-10-10: 40 mg via INTRAVENOUS
  Administered 2022-10-10: 20 mg via INTRAVENOUS
  Administered 2022-10-10: 80 mg via INTRAVENOUS

## 2022-10-10 MED ORDER — LORAZEPAM 1 MG PO TABS: 1.0000 mg | ORAL_TABLET | ORAL | Status: DC | PRN

## 2022-10-10 MED ORDER — LIDOCAINE HCL (CARDIAC) PF 100 MG/5ML IV SOSY
PREFILLED_SYRINGE | INTRAVENOUS | Status: DC | PRN
  Administered 2022-10-10: 100 mg via INTRAVENOUS

## 2022-10-10 MED ORDER — MIDAZOLAM HCL 2 MG/2ML IJ SOLN
INTRAMUSCULAR | Status: AC
  Filled 2022-10-10: qty 2

## 2022-10-10 MED ORDER — ADULT MULTIVITAMIN W/MINERALS CH
1.0000 | ORAL_TABLET | Freq: Every day | ORAL | Status: DC
  Administered 2022-10-10 – 2022-10-11 (×2): 1 via ORAL
  Filled 2022-10-10 (×2): qty 1

## 2022-10-10 MED ORDER — LORAZEPAM 0.5 MG PO TABS
0.5000 mg | ORAL_TABLET | ORAL | Status: DC | PRN
  Administered 2022-10-11 (×2): 0.5 mg via ORAL
  Filled 2022-10-10 (×2): qty 1

## 2022-10-10 MED ORDER — THIAMINE MONONITRATE 100 MG PO TABS
100.0000 mg | ORAL_TABLET | Freq: Every day | ORAL | Status: DC
  Administered 2022-10-10 – 2022-10-11 (×2): 100 mg via ORAL
  Filled 2022-10-10 (×2): qty 1

## 2022-10-10 MED ORDER — DEXMEDETOMIDINE HCL IN NACL 80 MCG/20ML IV SOLN
INTRAVENOUS | Status: DC | PRN
  Administered 2022-10-10 (×3): 4 ug via BUCCAL

## 2022-10-10 MED ORDER — MIDAZOLAM HCL 2 MG/2ML IJ SOLN
INTRAMUSCULAR | Status: DC | PRN
  Administered 2022-10-10: 2 mg via INTRAVENOUS

## 2022-10-10 MED ORDER — GLYCOPYRROLATE 0.2 MG/ML IJ SOLN
INTRAMUSCULAR | Status: DC | PRN
  Administered 2022-10-10: .2 mg via INTRAVENOUS

## 2022-10-10 MED ORDER — ONDANSETRON HCL 4 MG PO TABS: 4.0000 mg | ORAL_TABLET | Freq: Four times a day (QID) | ORAL | Status: DC | PRN

## 2022-10-10 MED ORDER — PANTOPRAZOLE 80MG IVPB - SIMPLE MED
80.0000 mg | Freq: Once | INTRAVENOUS | Status: AC
  Administered 2022-10-10: 80 mg via INTRAVENOUS
  Filled 2022-10-10: qty 100

## 2022-10-10 MED ORDER — PEG 3350-KCL-NA BICARB-NACL 420 G PO SOLR
4000.0000 mL | Freq: Once | ORAL | Status: AC
  Administered 2022-10-10: 4000 mL via ORAL
  Filled 2022-10-10: qty 4000

## 2022-10-10 MED ORDER — SODIUM CHLORIDE 0.9 % IV SOLN
2.0000 g | Freq: Once | INTRAVENOUS | Status: AC
  Administered 2022-10-10: 2 g via INTRAVENOUS
  Filled 2022-10-10: qty 20

## 2022-10-10 MED ORDER — SODIUM CHLORIDE 0.9 % IV SOLN
250.0000 mg | Freq: Once | INTRAVENOUS | Status: AC
  Administered 2022-10-10: 250 mg via INTRAVENOUS
  Filled 2022-10-10: qty 5

## 2022-10-10 MED ORDER — OCTREOTIDE LOAD VIA INFUSION
50.0000 ug | Freq: Once | INTRAVENOUS | Status: AC
  Administered 2022-10-10: 50 ug via INTRAVENOUS
  Filled 2022-10-10: qty 25

## 2022-10-10 NOTE — Op Note (Signed)
[facility] Patient Name: Alexander Wilkins Procedure Date: 10/10/2022 3:55 PM MRN: [id number] Account #: [id number] Date of Birth: 02-Jan-1961 Admit Type: Inpatient Age: 62 Room: [facility] ENDO ROOM 3 Gender: Male Note Status: Finalized Instrument Name: Upper Endoscope [id number] Procedure:             Upper GI endoscopy Indications:           Melena Providers:             Annamaria Helling DO, DO, Abran Richard Medicines:             Monitored Anesthesia Care Complications:         No immediate complications. Estimated blood loss: None. Procedure:             Pre-Anesthesia Assessment:                        - Prior to the procedure, a History and Physical was                         performed, and patient medications and allergies were                         reviewed. The patient is competent. The risks and                         benefits of the procedure and the sedation options and                         risks were discussed with the patient. All questions                         were answered and informed consent was obtained.                         Patient identification and proposed procedure were                         verified by the physician, the nurse, the anesthetist                         and the technician in the endoscopy suite. Mental                         Status Examination: alert and oriented. Airway                         Examination: normal oropharyngeal airway and neck                         mobility. Respiratory Examination: clear to                         auscultation. CV Examination: RRR, no murmurs, no S3                         or S4. Prophylactic Antibiotics: The patient does not  require prophylactic antibiotics. Prior                         Anticoagulants: The patient has taken no anticoagulant                         or antiplatelet agents. ASA Grade Assessment: IV - A                          patient with severe systemic disease that is a                         constant threat to life. After reviewing the risks and                         benefits, the patient was deemed in satisfactory                         condition to undergo the procedure. The anesthesia                         plan was to use monitored anesthesia care (MAC).                         Immediately prior to administration of medications,                         the patient was re-assessed for adequacy to receive                         sedatives. The heart rate, respiratory rate, oxygen                         saturations, blood pressure, adequacy of pulmonary                         ventilation, and response to care were monitored                         throughout the procedure. The physical status of the                         patient was re-assessed after the procedure.                        After obtaining informed consent, the endoscope was                         passed under direct vision. Throughout the procedure,                         the patient's blood pressure, pulse, and oxygen                         saturations were monitored continuously. The Endoscope                         was introduced through the mouth, and advanced to the  second part of duodenum. The upper GI endoscopy was                         accomplished without difficulty. The patient tolerated                         the procedure well. Findings:      Diffuse moderate inflammation characterized by erythema and friability       was found in the duodenal bulb, in the first portion of the duodenum and       in the second portion of the duodenum. Estimated blood loss: none.      Localized mild inflammation characterized by erosions and erythema was       found in the gastric antrum. Estimated blood loss: none.      The exam of the stomach was otherwise normal.      No GAVE, Portal gastropathy, or  gastric varices. No fresh or old blood       in the gastric lumen.      Esophagogastric landmarks were identified: the gastroesophageal junction       was found at 40 cm from the incisors.      The Z-line was regular. Estimated blood loss: none.      The exam of the esophagus was otherwise normal.      no esophageal varices Impression:            - Duodenitis.                        - Gastritis.                        - Esophagogastric landmarks identified.                        - Z-line regular.                        - No specimens collected. Recommendation:        - Return patient to hospital ward for ongoing care.                        - Clear liquid diet.                        - Continue present medications.                        - Recommend colonoscopy tomorrow.                        go lytely prep to start.                        - The findings and recommendations were discussed with                         the patient.                        - The findings and recommendations were discussed with  the referring physician. Procedure Code(s):     --- Professional ---                        [phone], Esophagogastroduodenoscopy, flexible,                         transoral; diagnostic, including collection of                         specimen(s) by brushing or washing, when performed                         (separate procedure) Diagnosis Code(s):     --- Professional ---                        K29.80, Duodenitis without bleeding                        K29.70, Gastritis, unspecified, without bleeding                        K92.1, Melena (includes Hematochezia) CPT copyright 2022 American Medical Association. All rights reserved. The codes documented in this report are preliminary and upon coder review may  be revised to meet current compliance requirements. Attending Participation:      I personally performed the entire procedure. Volney American, DO Annamaria Helling DO, DO 10/10/2022 4:21:40 PM This report has been signed electronically. Abran Richard,  Number of Addenda: 0 Note Initiated On: 10/10/2022 3:55 PM Estimated Blood Loss:  Estimated blood loss: none.      [facility]

## 2022-10-10 NOTE — Telephone Encounter (Signed)
Pt called yesterday afternoon and left a message that he was having black stools. I returned his call this morning and he states he is at the GI office in [place], because we took to long to call him back.

## 2022-10-10 NOTE — ED Triage Notes (Signed)
Pt reports dark tarry stools for at least 10 days. Labs done at office show hgb went from 14 to 10. Reports hx of etoh.

## 2022-10-10 NOTE — Consult Note (Signed)
Inpatient Consultation   Patient ID: Alexander Wilkins is a 62 y.o. male.  Requesting Provider: Duffy Bruce, MD  Date of Admission: 10/10/2022  Date of Consult: 10/10/22   Reason for Consultation: anemia, melena   Patient's Chief Complaint:   Chief Complaint  Patient presents with   Blood In Stools    62 y/o CM c Cirrhosis, etoh abuse, HCV who presents to the hospital under the direction of his [facility] gastroenterologist for melena and acute drop in hemoglobin.  GI is consulted for evaluation.  Patient notes that he has had dark stools over the last 10 days.  He has not noted any hematochezia or hematemesis.  Some nausea but no vomiting.  He denies any NSAID use recently.  He used to frequently consume NSAIDs but stopped a few months ago.  Currently drinks approximately 1 L of wine per day.  He is not currently on any blood thinners.  His hemoglobin at baseline is around 13 (last checked 10 months ago).  Today it is around 10.  Notably macrocytic.  He denies any Pepto-Bismol use or iron supplementation.  Denies any current abdominal pain shortness of breath, dyspnea on exertion or chest pain.  No dysphagia or odynophagia.    He was diagnosed with cirrhosis approximately 3 to 4 months ago per the patient  Vital signs are currently stable.  He did consume breakfast at 07 100 today consisting of oatmeal and a piece of toast. He has not never undergone upper or lower endoscopy  Denies NSAIDs, Anti-plt agents, and anticoagulants Denies family history of gastrointestinal disease and malignancy Previous Endoscopies: none  Underwent Cologuard in April 2023 which was negative    Past Medical History:  Diagnosis Date   Arthritis    Chronic hepatitis C ([place])    Genotype Ia   Cirrhosis ([place])     Past Surgical History:  Procedure Laterality Date   KNEE SURGERY Right 1974   ORIF ELBOW FRACTURE Right 08/30/2020   Procedure: OPEN REDUCTION INTERNAL FIXATION (ORIF)  ELBOW/OLECRANON FRACTURE;  Surgeon: Hessie Knows, MD;  Location: [facility] ORS;  Service: Orthopedics;  Laterality: Right;   WRIST SURGERY Left     Allergies  Allergen Reactions   Tramadol Hives    Family History  Problem Relation Age of Onset   Colon cancer Neg Hx     Social History   Tobacco Use   Smoking status: Former   Smokeless tobacco: Never  Substance Use Topics   Alcohol use: Yes    Comment: 2-3 bourbon and water, glass of wine, or beer each night. 2 glasses of wine in the last 4 weeks (01/17/22)   Drug use: No     Pertinent GI related history and allergies were reviewed with the patient  Review of Systems  Constitutional:  Negative for activity change, appetite change, chills, diaphoresis, fatigue, fever and unexpected weight change.  HENT:  Negative for trouble swallowing and voice change.   Respiratory:  Negative for shortness of breath and wheezing.   Cardiovascular:  Negative for chest pain, palpitations and leg swelling.  Gastrointestinal:  Negative for abdominal distention, abdominal pain, anal bleeding, blood in stool, constipation, diarrhea, nausea and vomiting.       + Melena  Musculoskeletal:  Negative for arthralgias and myalgias.  Skin:  Negative for color change and pallor.  Neurological:  Negative for dizziness, syncope and weakness.  Psychiatric/Behavioral:  Negative for confusion. The patient is not nervous/anxious.   All other systems reviewed and are  negative.    Medications Home Medications No current facility-administered medications on file prior to encounter.   Current Outpatient Medications on File Prior to Encounter  Medication Sig Dispense Refill   Cyanocobalamin (VITAMIN B 12) 500 MCG TABS Take by mouth.     ibuprofen (ADVIL) 600 MG tablet Take 600 mg by mouth every 6 (six) hours as needed. (Patient not taking: Reported on 01/17/2022)     milk thistle 175 MG tablet Take 175 mg by mouth daily.     Multiple Vitamin (MULTIVITAMIN) tablet  Take 1 tablet by mouth daily.     sildenafil (VIAGRA) 100 MG tablet 1/2-1 30 minutes before sex as needed (Patient not taking: Reported on 10/09/2021) 30 tablet prn   Sofosbuvir-Velpatasvir (EPCLUSA) 400-100 MG TABS Take 1 tablet by mouth daily. 28 tablet 2   Turmeric 500 MG TABS Take by mouth.     Pertinent GI related medications were reviewed with the patient  Inpatient Medications  Current Facility-Administered Medications:    cefTRIAXone (ROCEPHIN) 2 g in sodium chloride 0.9 % 100 mL IVPB, 2 g, Intravenous, Once, Duffy Bruce, MD   erythromycin 250 mg in sodium chloride 0.9 % 100 mL IVPB, 250 mg, Intravenous, Once, Annamaria Helling, DO   octreotide (SANDOSTATIN) 2 mcg/mL load via infusion 50 mcg, 50 mcg, Intravenous, Once **AND** octreotide (SANDOSTATIN) 500 mcg in sodium chloride 0.9 % 250 mL (2 mcg/mL) infusion, 50 mcg/hr, Intravenous, Continuous, Duffy Bruce, MD   pantoprazole (PROTONIX) 80 mg /NS 100 mL IVPB, 80 mg, Intravenous, Once, Duffy Bruce, MD   pantoprozole (PROTONIX) 80 mg /NS 100 mL infusion, 8 mg/hr, Intravenous, Continuous, Duffy Bruce, MD  Current Outpatient Medications:    Cyanocobalamin (VITAMIN B 12) 500 MCG TABS, Take by mouth., Disp: , Rfl:    ibuprofen (ADVIL) 600 MG tablet, Take 600 mg by mouth every 6 (six) hours as needed. (Patient not taking: Reported on 01/17/2022), Disp: , Rfl:    milk thistle 175 MG tablet, Take 175 mg by mouth daily., Disp: , Rfl:    Multiple Vitamin (MULTIVITAMIN) tablet, Take 1 tablet by mouth daily., Disp: , Rfl:    sildenafil (VIAGRA) 100 MG tablet, 1/2-1 30 minutes before sex as needed (Patient not taking: Reported on 10/09/2021), Disp: 30 tablet, Rfl: prn   Sofosbuvir-Velpatasvir (EPCLUSA) 400-100 MG TABS, Take 1 tablet by mouth daily., Disp: 28 tablet, Rfl: 2   Turmeric 500 MG TABS, Take by mouth., Disp: , Rfl:   cefTRIAXone (ROCEPHIN)  IV     erythromycin     octreotide (SANDOSTATIN) 500 mcg in sodium chloride 0.9  % 250 mL (2 mcg/mL) infusion     pantoprazole     pantoprazole         Objective   Vitals:   10/10/22 1216 10/10/22 1217  BP: 131/77   Pulse: 91   Resp: 16   Temp: 99.2 F (37.3 C)   TempSrc: Oral   SpO2: 100%   Weight:  95.3 kg  Height:  6\' 2"  (1.88 m)     Physical Exam Vitals and nursing note reviewed.  Constitutional:      General: He is not in acute distress.    Appearance: Normal appearance. He is not ill-appearing, toxic-appearing or diaphoretic.  HENT:     Head: Normocephalic and atraumatic.     Nose: Nose normal.     Mouth/Throat:     Mouth: Mucous membranes are moist.     Pharynx: Oropharynx is clear.  Eyes:  General: No scleral icterus.    Extraocular Movements: Extraocular movements intact.  Cardiovascular:     Rate and Rhythm: Normal rate and regular rhythm.     Heart sounds: Normal heart sounds. No murmur heard.    No friction rub. No gallop.  Pulmonary:     Effort: Pulmonary effort is normal. No respiratory distress.     Breath sounds: Normal breath sounds. No wheezing, rhonchi or rales.  Abdominal:     General: Abdomen is flat. Bowel sounds are normal. There is no distension.     Palpations: Abdomen is soft.     Tenderness: There is no abdominal tenderness. There is no guarding or rebound.  Musculoskeletal:     Cervical back: Neck supple.     Right lower leg: No edema.     Left lower leg: No edema.  Skin:    General: Skin is warm and dry.     Coloration: Skin is not jaundiced or pale.  Neurological:     General: No focal deficit present.     Mental Status: He is alert and oriented to person, place, and time. Mental status is at baseline.  Psychiatric:        Mood and Affect: Mood normal.        Behavior: Behavior normal.        Thought Content: Thought content normal.        Judgment: Judgment normal.     Laboratory Data Recent Labs  Lab 10/10/22 1220  WBC 6.8  HGB 10.0*  HCT 29.5*  PLT 169   Recent Labs  Lab 10/10/22 1220   NA 137  K 4.5  CL 104  CO2 25  BUN 15  CALCIUM 9.2  PROT 8.3*  BILITOT 0.7  ALKPHOS 63  ALT 12  AST 28  GLUCOSE 102*   No results for input(s): "INR" in the last 168 hours.  No results for input(s): "LIPASE" in the last 72 hours.      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 Labs and imaging reviewed by me Labs in am- cmp, cbc, inr Protonix 40 mg iv q12 h Hold dvt ppx Octreotide bolus and gtt for 72 hours total Ceftriaxone 1 g iv q24 for 7 days total for sbp ppx Monitor H&H.  Transfusion and resuscitation as per primary team  - if transfusion required- keep hgb btwn 7 and 9 Avoid frequent lab draws to prevent lab induced anemia Supportive care and antiemetics as per primary team Maintain two sites IV access Avoid nsaids Monitor for GIB. Collect HCV RNA to assess for SVR  CIWA protocol  Further recommendations pending endoscopy. See report for details  Esophagogastroduodenoscopy with possible biopsy, control of bleeding, polypectomy, and interventions as necessary has been discussed with the patient/patient representative. Informed consent was obtained from the patient/patient representative after explaining the indication, nature, and risks of the procedure including but not limited to death, bleeding, perforation, missed neoplasm/lesions, cardiorespiratory compromise, and reaction to medications. Opportunity for questions was given and appropriate answers  were provided. Patient/patient representative has  verbalized understanding is amenable to undergoing the procedure.  I personally performed the service.  Management of other medical comorbidities as per primary team  Thank you for allowing [place] to participate in this patient's care. Please don't hesitate to call if any questions or concerns arise.   Annamaria Helling, DO [facility]  Portions of the record may have been created with voice recognition software. Occasional wrong-word or 'sound-a-like' substitutions may have occurred due to the inherent limitations of voice recognition software.  Read the chart carefully and recognize, using context, where substitutions may have occurred.

## 2022-10-10 NOTE — Care Plan (Signed)
Post GI Op note  Please see op note for further details EGD demonstrating gastritis and duodenitis with small erosions, but no large ulcer or major bleeding source. Suspect that these have contributed to slow decrease in patient's hemoglobin over time given there is a 37-month check in between the 2 No signs of varices portal gastropathy or GAVE  Recommend colonoscopy tomorrow Clear liquids today.  N.p.o. at midnight GoLytely prep ordered.  Okay to drink GoLytely after n.p.o. midnight order Labs in a.m. Continue medications at this time Discussed with patient and with hospitalist  Will continue to follow  Ronne Binning, [facility]

## 2022-10-10 NOTE — Assessment & Plan Note (Signed)
Hemoglobin 10 on presentation with baseline hemoglobin around 12-14 in the setting of upper GI bleeding Secondary to GI blood losses Pending formal endoscopy Will trend serial hemoglobins Transfuse for hemoglobin less than 7 Check anemia panel Follow

## 2022-10-10 NOTE — ED Provider Notes (Signed)
[facility] Provider Note    Event Date/Time   First MD Initiated Contact with Patient 10/10/22 1222     (approximate)   History   Blood In Stools   HPI  Alexander Wilkins is a 62 y.o. male here with melena for 2 weeks.  The patient states that over the last 2 weeks, he separatively worsening dark, tarry, foul-smelling stool.  He has never had anything similar to this.  Does have history of cirrhosis but states it is mild.  He does continue to drink.  He states he also used to take NSAIDs fairly regularly but no longer takes this.  He went to GI clinic today and was sent in for evaluation.  No known history of varices.  Has never had fluid drained off of his abdomen.     Physical Exam   Triage Vital Signs: ED Triage Vitals  Enc Vitals Group     BP 10/10/22 1216 131/77     Pulse Rate 10/10/22 1216 91     Resp 10/10/22 1216 16     Temp 10/10/22 1216 99.2 F (37.3 C)     Temp Source 10/10/22 1216 Oral     SpO2 10/10/22 1216 100 %     Weight 10/10/22 1217 210 lb (95.3 kg)     Height 10/10/22 1217 6\' 2"  (1.88 m)     Head Circumference --      Peak Flow --      Pain Score 10/10/22 1217 0     Pain Loc --      Pain Edu? --      Excl. in [place]? --     Most recent vital signs: Vitals:   10/10/22 1216  BP: 131/77  Pulse: 91  Resp: 16  Temp: 99.2 F (37.3 C)  SpO2: 100%     General: Awake, no distress.  CV:  Good peripheral perfusion.  Resp:  Normal work of breathing.  Abd:  No distention.  No significant tenderness.  No rebound or guarding. Other:  Well-appearing.  No pallor.   ED Results / Procedures / Treatments   Labs (all labs ordered are listed, but only abnormal results are displayed) Labs Reviewed  COMPREHENSIVE METABOLIC PANEL - Abnormal; Notable for the following components:      Result Value   Glucose, Bld 102 (*)    Total Protein 8.3 (*)    All other components within normal limits  CBC - Abnormal; Notable for the following  components:   RBC 2.81 (*)    Hemoglobin 10.0 (*)    HCT 29.5 (*)    MCV 105.0 (*)    MCH 35.6 (*)    All other components within normal limits  POC OCCULT BLOOD, ED  TYPE AND SCREEN     EKG Normal sinus rhythm, trickle rate 87.  PR 145, QRS 88, QTc 442.  No acute ST elevations or depressions.  EKG evidence of acute ischemia or infarct.   RADIOLOGY None   I also independently reviewed and agree with radiologist interpretations.   PROCEDURES:  Critical Care performed: No  .1-3 Lead EKG Interpretation  Performed by: Duffy Bruce, MD Authorized by: Duffy Bruce, MD     Interpretation: normal     ECG rate:  90-100   ECG rate assessment: normal     Rhythm: sinus rhythm     Ectopy: none     Conduction: normal   Comments:     Indication: Chest pain/GI bleed  MEDICATIONS ORDERED IN ED: Medications  pantoprozole (PROTONIX) 80 mg /NS 100 mL infusion (has no administration in time range)  octreotide (SANDOSTATIN) 2 mcg/mL load via infusion 50 mcg (has no administration in time range)    And  octreotide (SANDOSTATIN) 500 mcg in sodium chloride 0.9 % 250 mL (2 mcg/mL) infusion (has no administration in time range)  erythromycin 250 mg in sodium chloride 0.9 % 100 mL IVPB (has no administration in time range)  0.9 %  sodium chloride infusion (has no administration in time range)  ondansetron (ZOFRAN) tablet 4 mg (has no administration in time range)    Or  ondansetron (ZOFRAN) injection 4 mg (has no administration in time range)  LORazepam (ATIVAN) tablet 1-4 mg (has no administration in time range)    Or  LORazepam (ATIVAN) tablet 0.5 mg (has no administration in time range)  thiamine (VITAMIN B1) tablet 100 mg (has no administration in time range)    Or  thiamine (VITAMIN B1) injection 100 mg (has no administration in time range)  folic acid (FOLVITE) tablet 1 mg (has no administration in time range)  multivitamin with minerals tablet 1 tablet (has no  administration in time range)  pantoprazole (PROTONIX) 80 mg /NS 100 mL IVPB (0 mg Intravenous Stopped 10/10/22 1355)  cefTRIAXone (ROCEPHIN) 2 g in sodium chloride 0.9 % 100 mL IVPB (0 g Intravenous Stopped 10/10/22 1340)     IMPRESSION / MDM / ASSESSMENT AND PLAN / ED COURSE  I reviewed the triage vital signs and the nursing notes.                              Differential diagnosis includes, but is not limited to, upper GI bleed secondary to peptic ulcer disease, gastric varices, AVM, lower GI bleed  Patient's presentation is most consistent with acute presentation with potential threat to life or bodily function.  The patient is on the cardiac monitor to evaluate for evidence of arrhythmia and/or significant heart rate changes   62 year old male with history of cirrhosis, likely alcoholic, here with melena.  Hemoglobin 10, down from 14 2 years ago but unsure of any interim values.  He appears hemodynamically stable.  He has gross melena.  Case discussed with Dr. Virgina Jock of GI, who will scope the patient.  Patient started on Protonix, octreotide, and given IV antibiotics.  No known history of varices.  Will admit to the hospitalist service.   FINAL CLINICAL IMPRESSION(S) / ED DIAGNOSES   Final diagnoses:  Upper GI bleed  Melena     Rx / DC Orders   ED Discharge Orders     None        Note:  This document was prepared using Dragon voice recognition software and may include unintentional dictation errors.   Duffy Bruce, MD 10/10/22 1357

## 2022-10-10 NOTE — Assessment & Plan Note (Signed)
Prior history of patient being hep C antibody positive per patient Status post course of Epclusa LFT stable Follow

## 2022-10-10 NOTE — Anesthesia Preprocedure Evaluation (Signed)
Anesthesia Evaluation  Patient identified by MRN, date of birth, ID band Patient awake    Reviewed: Allergy & Precautions, NPO status , Patient's Chart, lab work & pertinent test results  History of Anesthesia Complications Negative for: history of anesthetic complications  Airway Mallampati: III  TM Distance: >3 FB Neck ROM: Full    Dental  (+) Teeth Intact, Dental Advidsory Given   Pulmonary neg shortness of breath, neg sleep apnea, neg COPD, neg recent URI, Patient abstained from smoking., former smoker   Pulmonary exam normal breath sounds clear to auscultation       Cardiovascular Exercise Tolerance: Good METS(-) hypertension(-) angina (-) CAD and (-) Past MI negative cardio ROS (-) dysrhythmias  Rhythm:Regular Rate:Normal - Systolic murmurs    Neuro/Psych negative neurological ROS  negative psych ROS   GI/Hepatic ,neg GERD  ,,(+) Cirrhosis     substance abuse  alcohol use and marijuana use, Hepatitis -, C  Endo/Other  neg diabetes    Renal/GU negative Renal ROS     Musculoskeletal   Abdominal   Peds  Hematology   Anesthesia Other Findings No past medical history on file.  Reproductive/Obstetrics                             Anesthesia Physical Anesthesia Plan  ASA: 3  Anesthesia Plan: General   Post-op Pain Management:  Regional for Post-op pain and Minimal or no pain anticipated   Induction: Intravenous  PONV Risk Score and Plan: 2 and Propofol infusion, TIVA and Treatment may vary due to age or medical condition  Airway Management Planned: Natural Airway and Nasal Cannula  Additional Equipment: None  Intra-op Plan:   Post-operative Plan:   Informed Consent: I have reviewed the patients History and Physical, chart, labs and discussed the procedure including the risks, benefits and alternatives for the proposed anesthesia with the patient or authorized representative  who has indicated his/her understanding and acceptance.     Dental advisory given  Plan Discussed with: CRNA and Surgeon  Anesthesia Plan Comments:         Anesthesia Quick Evaluation

## 2022-10-10 NOTE — H&P (Signed)
History and Physical    Patient: Alexander Wilkins [id number] DOB: Apr 24, 1961 DOA: 10/10/2022 DOS: the patient was seen and examined on 10/10/2022 PCP: [facility]  Patient coming from: Home  Chief Complaint:  Chief Complaint  Patient presents with   Blood In Stools   HPI: Alexander Wilkins is a 62 y.o. male with medical history significant of cirrhosis, hepatitis C, alcohol abuse presenting with upper GI bleed.  Patient reports approximately 10 days of black and tarry stools.  Patient was referred from the gastroenterology service in [place].  No fevers or chills.  No nausea or vomiting.  No abdominal pain.  Has had multiple dark black bowel movements every day.  Minimal weakness.  Does admit to drinking at least 1 to 2 pints of liquor a week in addition to 3-4 bottles of wine and occasional beer per week.  Does also use NSAIDs including ibuprofen/Aleve on a regular basis.  Denies any prior history of gastrointestinal bleeding in the past.  No reported prior colonoscopies or endoscopies.  No hemiparesis or confusion.  Intermittent tobacco use as well as intermittent marijuana use. Presented to the ER afebrile, hemodynamically stable.  White count 6.8, hemoglobin 10 with a baseline hemoglobin around 12-14, platelets 170.  Creatinine 9.2, LFTs and T. bili grossly within normal limits.  Dr. Virgina Jock with gastroenterology formally consulted.  Planning for formal endoscopy.  Started on IV Protonix as well as IV octreotide in the ER.  Review of Systems: As mentioned in the history of present illness. All other systems reviewed and are negative. Past Medical History:  Diagnosis Date   Arthritis    Chronic hepatitis C ([place])    Genotype Ia   Cirrhosis ([place])    Past Surgical History:  Procedure Laterality Date   KNEE SURGERY Right 1974   ORIF ELBOW FRACTURE Right 08/30/2020   Procedure: OPEN REDUCTION INTERNAL FIXATION (ORIF) ELBOW/OLECRANON FRACTURE;  Surgeon: Hessie Knows, MD;  Location: [facility] ORS;  Service: Orthopedics;  Laterality: Right;   WRIST SURGERY Left    Social History:  reports that he has quit smoking. He has never used smokeless tobacco. He reports current alcohol use. He reports that he does not use drugs.  Allergies  Allergen Reactions   Tramadol Hives    Family History  Problem Relation Age of Onset   Colon cancer Neg Hx     Prior to Admission medications   Medication Sig Start Date End Date Taking? Authorizing Provider  albuterol (VENTOLIN HFA) 108 (90 Base) MCG/ACT inhaler Inhale 1 puff into the lungs every 6 (six) hours as needed. 07/09/22  Yes [provider]  tamsulosin (FLOMAX) 0.4 MG CAPS capsule Take 0.4 mg by mouth daily. 07/23/22  Yes [provider]  Cyanocobalamin (VITAMIN B 12) 500 MCG TABS Take by mouth.    [provider]  ibuprofen (ADVIL) 600 MG tablet Take 600 mg by mouth every 6 (six) hours as needed. Patient not taking: Reported on 01/17/2022    [provider]  milk thistle 175 MG tablet Take 175 mg by mouth daily.    [provider]  Multiple Vitamin (MULTIVITAMIN) tablet Take 1 tablet by mouth daily.    [provider]  sildenafil (VIAGRA) 100 MG tablet 1/2-1 30 minutes before sex as needed Patient not taking: Reported on 10/09/2021 02/19/21   Franchot Gallo, MD  Sofosbuvir-Velpatasvir (EPCLUSA) 400-100 MG TABS Take 1 tablet by mouth daily. 01/24/22   Erenest Rasher, PA-C  Turmeric  500 MG TABS Take by mouth.    [provider]    Physical Exam: Vitals:   10/10/22 1216 10/10/22 1217  BP: 131/77   Pulse: 91   Resp: 16   Temp: 99.2 F (37.3 C)   TempSrc: Oral   SpO2: 100%   Weight:  95.3 kg  Height:  6\' 2"  (1.88 m)   Physical Exam Constitutional:      General: He is not in acute distress.    Appearance: He is normal weight.  HENT:     Head: Normocephalic and atraumatic.     Nose: Nose normal.     Mouth/Throat:     Mouth:  Mucous membranes are moist.  Eyes:     Pupils: Pupils are equal, round, and reactive to light.  Cardiovascular:     Rate and Rhythm: Normal rate and regular rhythm.  Pulmonary:     Effort: Pulmonary effort is normal.  Abdominal:     General: Abdomen is flat. Bowel sounds are normal.  Musculoskeletal:        General: Normal range of motion.     Cervical back: Normal range of motion.  Skin:    General: Skin is warm.  Neurological:     General: No focal deficit present.  Psychiatric:        Mood and Affect: Mood normal.     Data Reviewed:  There are no new results to review at this time. US ABDOMEN COMPLETE W/ELASTOGRAPHY CLINICAL DATA:  Hepatitis C  EXAM: ULTRASOUND ABDOMEN  ULTRASOUND HEPATIC ELASTOGRAPHY  TECHNIQUE: Sonography of the upper abdomen was performed. In addition, ultrasound elastography evaluation of the liver was performed. A region of interest was placed within the right lobe of the liver. Following application of a compressive sonographic pulse, tissue compressibility was assessed. Multiple assessments were performed at the selected site. Median tissue compressibility was determined. Previously, hepatic stiffness was assessed by shear wave velocity. Based on recently published Society of Radiologists in Ultrasound consensus article, reporting is now recommended to be performed in the SI units of pressure (kiloPascals) representing hepatic stiffness/elasticity. The obtained result is compared to the published reference standards. (cACLD = compensated Advanced Chronic Liver Disease)  COMPARISON:  None Available.  FINDINGS: ULTRASOUND ABDOMEN  Gallbladder: No gallstones or wall thickening visualized. No sonographic Murphy sign noted by sonographer.  Common bile duct: Diameter: 5.9 mm  Liver: No focal lesion identified. Nodular liver contour with increased parenchymal echogenicity. Portal vein is patent on color Doppler imaging with normal  direction of blood flow towards the liver.  IVC: No abnormality visualized.  Pancreas: Visualized portion unremarkable.  Spleen: Size and appearance within normal limits.  Right Kidney: Length: 12.2. Echogenicity within normal limits. No mass or hydronephrosis visualized.  Left Kidney: Length: 12.0. Echogenicity within normal limits. No mass or hydronephrosis visualized.  Abdominal aorta: No aneurysm visualized.  Other findings: None.  ULTRASOUND HEPATIC ELASTOGRAPHY  Device: Siemens Helix VTQ  Patient position: Oblique  Transducer 5C1  Number of measurements: 13  Hepatic segment:  8  Median kPa: 8.5  IQR: 0.2  IQR/Median kPa ratio: 0.14  Data quality:  Good  Diagnostic category: < or = 9 kPa: in the absence of other known clinical signs, rules out cACLD  The use of hepatic elastography is applicable to patients with viral hepatitis and non-alcoholic fatty liver disease. At this time, there is insufficient data for the referenced cut-off values and use in other causes of liver disease, including alcoholic liver disease. Patients, however,  may be assessed by elastography and serve as their own reference standard/baseline.  In patients with non-alcoholic liver disease, the values suggesting compensated advanced chronic liver disease (cACLD) may be lower, and patients may need additional testing with elasticity results of 7-9 kPa.  Please note that abnormal hepatic elasticity and shear wave velocities may also be identified in clinical settings other than with hepatic fibrosis, such as: acute hepatitis, elevated right heart and central venous pressures including use of beta blockers, veno-occlusive disease (Budd-Chiari), infiltrative processes such as mastocytosis/amyloidosis/infiltrative tumor/lymphoma, extrahepatic cholestasis, with hyperemia in the post-prandial state, and with liver transplantation. Correlation with patient history, laboratory data, and  clinical condition recommended.  Diagnostic Categories:  < or =5 kPa: high probability of being normal  < or =9 kPa: in the absence of other known clinical signs, rules out cACLD  >9 kPa and ?13 kPa: suggestive of cACLD, but needs further testing  >13 kPa: highly suggestive of cACLD  > or =17 kPa: highly suggestive of cACLD with an increased probability of clinically significant portal hypertension  IMPRESSION: ULTRASOUND ABDOMEN:  1. Hepatic steatosis. 2. Nodular liver contour, findings is suggestive of cirrhosis.  ULTRASOUND HEPATIC ELASTOGRAPHY:  Median kPa:  8.5  Diagnostic category: < or = 9 kPa: in the absence of other known clinical signs, rules out cACLD  Electronically Signed   By: Yetta Glassman M.D.   On: 11/28/2021 09:49  Lab Results  Component Value Date   WBC 6.8 10/10/2022   HGB 10.0 (L) 10/10/2022   HCT 29.5 (L) 10/10/2022   MCV 105.0 (H) 10/10/2022   PLT 169 [id number]   Last metabolic panel Lab Results  Component Value Date   GLUCOSE 102 (H) 10/10/2022   NA 137 10/10/2022   K 4.5 10/10/2022   CL 104 10/10/2022   CO2 25 10/10/2022   BUN 15 10/10/2022   CREATININE 0.75 10/10/2022   GFRNONAA >60 10/10/2022   CALCIUM 9.2 10/10/2022   PROT 8.3 (H) 10/10/2022   ALBUMIN 3.8 10/10/2022   BILITOT 0.7 10/10/2022   ALKPHOS 63 10/10/2022   AST 28 10/10/2022   ALT 12 10/10/2022   ANIONGAP 8 10/10/2022    Assessment and Plan: * UGIB (upper gastrointestinal bleed) Recurrent dark/black stools x 10 days in the setting of regular alcohol use including hard liquor and wine and some intermittent beer as well as regular NSAID use Patient formally evaluated by gastroenterology with plan for upper endoscopy later today Started on Protonix drip as well as octreotide with concern for variceal disease Continue management per GI recommendations Hemoglobin currently at 10 with baseline hemoglobin around 12-14 Will continue to trend Transfuse for  hemoglobin less than 7 Follow  Alcohol abuse Patient reports drinking at least 1 to 2 pints of liquor a week as well as 3-4 bottles of wine a week with some occasional beer use Will check alcohol level as well as urine drug screen Does not appear to be actively withdrawing, though will start CIWA protocol Discussed alcohol cessation at length Follow  Acute blood loss anemia Hemoglobin 10 on presentation with baseline hemoglobin around 12-14 in the setting of upper GI bleeding Secondary to GI blood losses Pending formal endoscopy Will trend serial hemoglobins Transfuse for hemoglobin less than 7 Check anemia panel Follow   Other cirrhosis of liver ([place]) T. bili and LFTs grossly stable Will check INR Follow  Chronic hepatitis C without hepatic coma ([place]) Prior history of patient being hep C antibody positive per patient Status post course of  Epclusa LFT stable Follow      Advance Care Planning:   Code Status: Full Code   Consults: Dr. Virgina Jock w/ gastroenterology   Family Communication: No family at the bedside   Severity of Illness: The appropriate patient status for this patient is OBSERVATION. Observation status is judged to be reasonable and necessary in order to provide the required intensity of service to ensure the patient's safety. The patient's presenting symptoms, physical exam findings, and initial radiographic and laboratory data in the context of their medical condition is felt to place them at decreased risk for further clinical deterioration. Furthermore, it is anticipated that the patient will be medically stable for discharge from the hospital within 2 midnights of admission.   Author: Deneise Lever, MD 10/10/2022 2:03 PM  For on call review [email or web address].

## 2022-10-10 NOTE — Transfer of Care (Signed)
Immediate Anesthesia Transfer of Care Note  Patient: Alexander Wilkins  Procedure(s) Performed: ESOPHAGOGASTRODUODENOSCOPY (EGD) WITH PROPOFOL  Patient Location: Endoscopy Unit  Anesthesia Type:General  Level of Consciousness: drowsy  Airway & Oxygen Therapy: Patient Spontanous Breathing  Post-op Assessment: Report given to RN and Post -op Vital signs reviewed and stable  Post vital signs: Reviewed and stable  Last Vitals:  Vitals Value Taken Time  BP 94/55 10/10/22 1622  Temp 36 C 10/10/22 1621  Pulse 81 10/10/22 1624  Resp 14 10/10/22 1624  SpO2 100 % 10/10/22 1624  Vitals shown include unvalidated device data.  Last Pain:  Vitals:   10/10/22 1621  TempSrc: Temporal  PainSc: Asleep         Complications: No notable events documented.

## 2022-10-10 NOTE — Interval H&P Note (Signed)
History and Physical Interval Note: Preprocedure H&P from 10/10/22  was reviewed and there was no interval change after seeing and examining the patient.  Written consent was obtained from the patient after discussion of risks, benefits, and alternatives. Patient has consented to proceed with Esophagogastroduodenoscopy with possible intervention   10/10/2022 3:56 PM  Alexander Wilkins  has presented today for surgery, with the diagnosis of melena, anemia, cirrhosis.  The various methods of treatment have been discussed with the patient and family. After consideration of risks, benefits and other options for treatment, the patient has consented to  Procedure(s): ESOPHAGOGASTRODUODENOSCOPY (EGD) WITH PROPOFOL (N/A) as a surgical intervention.  The patient's history has been reviewed, patient examined, no change in status, stable for surgery.  I have reviewed the patient's chart and labs.  Questions were answered to the patient's satisfaction.     Annamaria Helling

## 2022-10-10 NOTE — Assessment & Plan Note (Signed)
T. bili and LFTs grossly stable Will check INR Follow

## 2022-10-10 NOTE — Assessment & Plan Note (Signed)
Recurrent dark/black stools x 10 days in the setting of regular alcohol use including hard liquor and wine and some intermittent beer as well as regular NSAID use Patient formally evaluated by gastroenterology with plan for upper endoscopy later today Started on Protonix drip as well as octreotide with concern for variceal disease Continue management per GI recommendations Hemoglobin currently at 10 with baseline hemoglobin around 12-14 Will continue to trend Transfuse for hemoglobin less than 7 Follow

## 2022-10-10 NOTE — Anesthesia Postprocedure Evaluation (Signed)
Anesthesia Post Note  Patient: Alexander Wilkins  Procedure(s) Performed: ESOPHAGOGASTRODUODENOSCOPY (EGD) WITH PROPOFOL  Patient location during evaluation: Endoscopy Anesthesia Type: General Level of consciousness: awake and alert Pain management: pain level controlled Vital Signs Assessment: post-procedure vital signs reviewed and stable Respiratory status: spontaneous breathing, nonlabored ventilation, respiratory function stable and patient connected to nasal cannula oxygen Cardiovascular status: blood pressure returned to baseline and stable Postop Assessment: no apparent nausea or vomiting Anesthetic complications: no   No notable events documented.   Last Vitals:  Vitals:   10/10/22 1641 10/10/22 1651  BP: (!) 87/60 130/77  Pulse: 85 85  Resp: 15 16  Temp:    SpO2: 100% 100%    Last Pain:  Vitals:   10/10/22 1651  TempSrc:   PainSc: 0-No pain                 Martha Clan

## 2022-10-10 NOTE — H&P (View-Only) (Signed)
Inpatient Consultation   Patient ID: Alexander Wilkins is a 62 y.o. male.  Requesting Provider: Duffy Bruce, MD  Date of Admission: 10/10/2022  Date of Consult: 10/10/22   Reason for Consultation: anemia, melena   Patient's Chief Complaint:   Chief Complaint  Patient presents with   Blood In Stools    62 y/o CM c Cirrhosis, etoh abuse, HCV who presents to the hospital under the direction of his [facility] gastroenterologist for melena and acute drop in hemoglobin.  GI is consulted for evaluation.  Patient notes that he has had dark stools over the last 10 days.  He has not noted any hematochezia or hematemesis.  Some nausea but no vomiting.  He denies any NSAID use recently.  He used to frequently consume NSAIDs but stopped a few months ago.  Currently drinks approximately 1 L of wine per day.  He is not currently on any blood thinners.  His hemoglobin at baseline is around 13 (last checked 10 months ago).  Today it is around 10.  Notably macrocytic.  He denies any Pepto-Bismol use or iron supplementation.  Denies any current abdominal pain shortness of breath, dyspnea on exertion or chest pain.  No dysphagia or odynophagia.    He was diagnosed with cirrhosis approximately 3 to 4 months ago per the patient  Vital signs are currently stable.  He did consume breakfast at 07 100 today consisting of oatmeal and a piece of toast. He has not never undergone upper or lower endoscopy  Denies NSAIDs, Anti-plt agents, and anticoagulants Denies family history of gastrointestinal disease and malignancy Previous Endoscopies: none  Underwent Cologuard in April 2023 which was negative    Past Medical History:  Diagnosis Date   Arthritis    Chronic hepatitis C ([place])    Genotype Ia   Cirrhosis ([place])     Past Surgical History:  Procedure Laterality Date   KNEE SURGERY Right 1974   ORIF ELBOW FRACTURE Right 08/30/2020   Procedure: OPEN REDUCTION INTERNAL FIXATION (ORIF)  ELBOW/OLECRANON FRACTURE;  Surgeon: Hessie Knows, MD;  Location: [facility] ORS;  Service: Orthopedics;  Laterality: Right;   WRIST SURGERY Left     Allergies  Allergen Reactions   Tramadol Hives    Family History  Problem Relation Age of Onset   Colon cancer Neg Hx     Social History   Tobacco Use   Smoking status: Former   Smokeless tobacco: Never  Substance Use Topics   Alcohol use: Yes    Comment: 2-3 bourbon and water, glass of wine, or beer each night. 2 glasses of wine in the last 4 weeks (01/17/22)   Drug use: No     Pertinent GI related history and allergies were reviewed with the patient  Review of Systems  Constitutional:  Negative for activity change, appetite change, chills, diaphoresis, fatigue, fever and unexpected weight change.  HENT:  Negative for trouble swallowing and voice change.   Respiratory:  Negative for shortness of breath and wheezing.   Cardiovascular:  Negative for chest pain, palpitations and leg swelling.  Gastrointestinal:  Negative for abdominal distention, abdominal pain, anal bleeding, blood in stool, constipation, diarrhea, nausea and vomiting.       + Melena  Musculoskeletal:  Negative for arthralgias and myalgias.  Skin:  Negative for color change and pallor.  Neurological:  Negative for dizziness, syncope and weakness.  Psychiatric/Behavioral:  Negative for confusion. The patient is not nervous/anxious.   All other systems reviewed and are  negative.    Medications Home Medications No current facility-administered medications on file prior to encounter.   Current Outpatient Medications on File Prior to Encounter  Medication Sig Dispense Refill   Cyanocobalamin (VITAMIN B 12) 500 MCG TABS Take by mouth.     ibuprofen (ADVIL) 600 MG tablet Take 600 mg by mouth every 6 (six) hours as needed. (Patient not taking: Reported on 01/17/2022)     milk thistle 175 MG tablet Take 175 mg by mouth daily.     Multiple Vitamin (MULTIVITAMIN) tablet  Take 1 tablet by mouth daily.     sildenafil (VIAGRA) 100 MG tablet 1/2-1 30 minutes before sex as needed (Patient not taking: Reported on 10/09/2021) 30 tablet prn   Sofosbuvir-Velpatasvir (EPCLUSA) 400-100 MG TABS Take 1 tablet by mouth daily. 28 tablet 2   Turmeric 500 MG TABS Take by mouth.     Pertinent GI related medications were reviewed with the patient  Inpatient Medications  Current Facility-Administered Medications:    cefTRIAXone (ROCEPHIN) 2 g in sodium chloride 0.9 % 100 mL IVPB, 2 g, Intravenous, Once, Duffy Bruce, MD   erythromycin 250 mg in sodium chloride 0.9 % 100 mL IVPB, 250 mg, Intravenous, Once, Annamaria Helling, DO   octreotide (SANDOSTATIN) 2 mcg/mL load via infusion 50 mcg, 50 mcg, Intravenous, Once **AND** octreotide (SANDOSTATIN) 500 mcg in sodium chloride 0.9 % 250 mL (2 mcg/mL) infusion, 50 mcg/hr, Intravenous, Continuous, Duffy Bruce, MD   pantoprazole (PROTONIX) 80 mg /NS 100 mL IVPB, 80 mg, Intravenous, Once, Duffy Bruce, MD   pantoprozole (PROTONIX) 80 mg /NS 100 mL infusion, 8 mg/hr, Intravenous, Continuous, Duffy Bruce, MD  Current Outpatient Medications:    Cyanocobalamin (VITAMIN B 12) 500 MCG TABS, Take by mouth., Disp: , Rfl:    ibuprofen (ADVIL) 600 MG tablet, Take 600 mg by mouth every 6 (six) hours as needed. (Patient not taking: Reported on 01/17/2022), Disp: , Rfl:    milk thistle 175 MG tablet, Take 175 mg by mouth daily., Disp: , Rfl:    Multiple Vitamin (MULTIVITAMIN) tablet, Take 1 tablet by mouth daily., Disp: , Rfl:    sildenafil (VIAGRA) 100 MG tablet, 1/2-1 30 minutes before sex as needed (Patient not taking: Reported on 10/09/2021), Disp: 30 tablet, Rfl: prn   Sofosbuvir-Velpatasvir (EPCLUSA) 400-100 MG TABS, Take 1 tablet by mouth daily., Disp: 28 tablet, Rfl: 2   Turmeric 500 MG TABS, Take by mouth., Disp: , Rfl:   cefTRIAXone (ROCEPHIN)  IV     erythromycin     octreotide (SANDOSTATIN) 500 mcg in sodium chloride 0.9  % 250 mL (2 mcg/mL) infusion     pantoprazole     pantoprazole         Objective   Vitals:   10/10/22 1216 10/10/22 1217  BP: 131/77   Pulse: 91   Resp: 16   Temp: 99.2 F (37.3 C)   TempSrc: Oral   SpO2: 100%   Weight:  95.3 kg  Height:  6\' 2"  (1.88 m)     Physical Exam Vitals and nursing note reviewed.  Constitutional:      General: He is not in acute distress.    Appearance: Normal appearance. He is not ill-appearing, toxic-appearing or diaphoretic.  HENT:     Head: Normocephalic and atraumatic.     Nose: Nose normal.     Mouth/Throat:     Mouth: Mucous membranes are moist.     Pharynx: Oropharynx is clear.  Eyes:  General: No scleral icterus.    Extraocular Movements: Extraocular movements intact.  Cardiovascular:     Rate and Rhythm: Normal rate and regular rhythm.     Heart sounds: Normal heart sounds. No murmur heard.    No friction rub. No gallop.  Pulmonary:     Effort: Pulmonary effort is normal. No respiratory distress.     Breath sounds: Normal breath sounds. No wheezing, rhonchi or rales.  Abdominal:     General: Abdomen is flat. Bowel sounds are normal. There is no distension.     Palpations: Abdomen is soft.     Tenderness: There is no abdominal tenderness. There is no guarding or rebound.  Musculoskeletal:     Cervical back: Neck supple.     Right lower leg: No edema.     Left lower leg: No edema.  Skin:    General: Skin is warm and dry.     Coloration: Skin is not jaundiced or pale.  Neurological:     General: No focal deficit present.     Mental Status: He is alert and oriented to person, place, and time. Mental status is at baseline.  Psychiatric:        Mood and Affect: Mood normal.        Behavior: Behavior normal.        Thought Content: Thought content normal.        Judgment: Judgment normal.     Laboratory Data Recent Labs  Lab 10/10/22 1220  WBC 6.8  HGB 10.0*  HCT 29.5*  PLT 169   Recent Labs  Lab 10/10/22 1220   NA 137  K 4.5  CL 104  CO2 25  BUN 15  CALCIUM 9.2  PROT 8.3*  BILITOT 0.7  ALKPHOS 63  ALT 12  AST 28  GLUCOSE 102*   No results for input(s): "INR" in the last 168 hours.  No results for input(s): "LIPASE" in the last 72 hours.      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 Labs and imaging reviewed by me Labs in am- cmp, cbc, inr Protonix 40 mg iv q12 h Hold dvt ppx Octreotide bolus and gtt for 72 hours total Ceftriaxone 1 g iv q24 for 7 days total for sbp ppx Monitor H&H.  Transfusion and resuscitation as per primary team  - if transfusion required- keep hgb btwn 7 and 9 Avoid frequent lab draws to prevent lab induced anemia Supportive care and antiemetics as per primary team Maintain two sites IV access Avoid nsaids Monitor for GIB. Collect HCV RNA to assess for SVR  CIWA protocol  Further recommendations pending endoscopy. See report for details  Esophagogastroduodenoscopy with possible biopsy, control of bleeding, polypectomy, and interventions as necessary has been discussed with the patient/patient representative. Informed consent was obtained from the patient/patient representative after explaining the indication, nature, and risks of the procedure including but not limited to death, bleeding, perforation, missed neoplasm/lesions, cardiorespiratory compromise, and reaction to medications. Opportunity for questions was given and appropriate answers  were provided. Patient/patient representative has  verbalized understanding is amenable to undergoing the procedure.  I personally performed the service.  Management of other medical comorbidities as per primary team  Thank you for allowing [place] to participate in this patient's care. Please don't hesitate to call if any questions or concerns arise.   Annamaria Helling, DO [facility]  Portions of the record may have been created with voice recognition software. Occasional wrong-word or 'sound-a-like' substitutions may have occurred due to the inherent limitations of voice recognition software.  Read the chart carefully and recognize, using context, where substitutions may have occurred.

## 2022-10-10 NOTE — Telephone Encounter (Signed)
I am sorry that he felt he did not get back in touch with him soon enough, but I am glad that he is being seen.  As he has established care with GI in [place], he will need to continue to see them for follow-up.

## 2022-10-10 NOTE — Assessment & Plan Note (Signed)
Patient reports drinking at least 1 to 2 pints of liquor a week as well as 3-4 bottles of wine a week with some occasional beer use Will check alcohol level as well as urine drug screen Does not appear to be actively withdrawing, though will start CIWA protocol Discussed alcohol cessation at length Follow

## 2022-10-11 ENCOUNTER — Observation Stay: Payer: 59 | Admitting: Anesthesiology

## 2022-10-11 ENCOUNTER — Inpatient Hospital Stay: Payer: 59

## 2022-10-11 ENCOUNTER — Encounter: Payer: Self-pay | Admitting: Family Medicine

## 2022-10-11 ENCOUNTER — Encounter
Admission: EM | Disposition: A | Discharge status: Home / Self Care | Payer: Self-pay | Source: Home / Self Care | Attending: Internal Medicine

## 2022-10-11 DIAGNOSIS — G8929 Other chronic pain: Secondary | ICD-10-CM | POA: Insufficient documentation

## 2022-10-11 DIAGNOSIS — Z885 Allergy status to narcotic agent status: Secondary | ICD-10-CM | POA: Diagnosis not present

## 2022-10-11 DIAGNOSIS — Z8619 Personal history of other infectious and parasitic diseases: Secondary | ICD-10-CM | POA: Diagnosis not present

## 2022-10-11 DIAGNOSIS — K921 Melena: Secondary | ICD-10-CM | POA: Diagnosis present

## 2022-10-11 DIAGNOSIS — K7469 Other cirrhosis of liver: Secondary | ICD-10-CM

## 2022-10-11 DIAGNOSIS — K319 Disease of stomach and duodenum, unspecified: Secondary | ICD-10-CM | POA: Diagnosis present

## 2022-10-11 DIAGNOSIS — K640 First degree hemorrhoids: Secondary | ICD-10-CM | POA: Diagnosis present

## 2022-10-11 DIAGNOSIS — K2971 Gastritis, unspecified, with bleeding: Secondary | ICD-10-CM | POA: Diagnosis present

## 2022-10-11 DIAGNOSIS — M1990 Unspecified osteoarthritis, unspecified site: Secondary | ICD-10-CM | POA: Diagnosis present

## 2022-10-11 DIAGNOSIS — K5730 Diverticulosis of large intestine without perforation or abscess without bleeding: Secondary | ICD-10-CM | POA: Diagnosis present

## 2022-10-11 DIAGNOSIS — Z79899 Other long term (current) drug therapy: Secondary | ICD-10-CM | POA: Diagnosis not present

## 2022-10-11 DIAGNOSIS — K2981 Duodenitis with bleeding: Secondary | ICD-10-CM | POA: Diagnosis present

## 2022-10-11 DIAGNOSIS — B182 Chronic viral hepatitis C: Secondary | ICD-10-CM | POA: Diagnosis not present

## 2022-10-11 DIAGNOSIS — F1010 Alcohol abuse, uncomplicated: Secondary | ICD-10-CM | POA: Diagnosis not present

## 2022-10-11 DIAGNOSIS — K56699 Other intestinal obstruction unspecified as to partial versus complete obstruction: Secondary | ICD-10-CM | POA: Diagnosis present

## 2022-10-11 DIAGNOSIS — Z87891 Personal history of nicotine dependence: Secondary | ICD-10-CM | POA: Diagnosis not present

## 2022-10-11 DIAGNOSIS — F1020 Alcohol dependence, uncomplicated: Secondary | ICD-10-CM | POA: Diagnosis present

## 2022-10-11 DIAGNOSIS — G894 Chronic pain syndrome: Secondary | ICD-10-CM | POA: Diagnosis present

## 2022-10-11 DIAGNOSIS — Z888 Allergy status to other drugs, medicaments and biological substances status: Secondary | ICD-10-CM | POA: Diagnosis not present

## 2022-10-11 DIAGNOSIS — D62 Acute posthemorrhagic anemia: Secondary | ICD-10-CM | POA: Diagnosis present

## 2022-10-11 DIAGNOSIS — K922 Gastrointestinal hemorrhage, unspecified: Secondary | ICD-10-CM | POA: Diagnosis not present

## 2022-10-11 HISTORY — PX: COLONOSCOPY WITH PROPOFOL: SHX5780

## 2022-10-11 LAB — CBC
HCT: 26.8 % — ABNORMAL LOW (ref 39.0–52.0)
Hemoglobin: 9 g/dL — ABNORMAL LOW (ref 13.0–17.0)
MCH: 34.9 pg — ABNORMAL HIGH (ref 26.0–34.0)
MCHC: 33.6 g/dL (ref 30.0–36.0)
MCV: 103.9 fL — ABNORMAL HIGH (ref 80.0–100.0)
Platelets: 153 10*3/uL (ref 150–400)
RBC: 2.58 MIL/uL — ABNORMAL LOW (ref 4.22–5.81)
RDW: 14.1 % (ref 11.5–15.5)
WBC: 5.6 10*3/uL (ref 4.0–10.5)
nRBC: 0 % (ref 0.0–0.2)

## 2022-10-11 LAB — COMPREHENSIVE METABOLIC PANEL
ALT: 12 U/L (ref 0–44)
AST: 29 U/L (ref 15–41)
Albumin: 3.3 g/dL — ABNORMAL LOW (ref 3.5–5.0)
Alkaline Phosphatase: 50 U/L (ref 38–126)
Anion gap: 7 (ref 5–15)
BUN: 10 mg/dL (ref 8–23)
CO2: 22 mmol/L (ref 22–32)
Calcium: 8.2 mg/dL — ABNORMAL LOW (ref 8.9–10.3)
Chloride: 106 mmol/L (ref 98–111)
Creatinine, Ser: 0.72 mg/dL (ref 0.61–1.24)
GFR, Estimated: >60 mL/min (ref >60)
Glucose, Bld: 126 mg/dL — ABNORMAL HIGH (ref 70–99)
Potassium: 4.1 mmol/L (ref 3.5–5.1)
Sodium: 135 mmol/L (ref 135–145)
Total Bilirubin: 0.6 mg/dL (ref 0.3–1.2)
Total Protein: 7.5 g/dL (ref 6.5–8.1)

## 2022-10-11 LAB — PROTIME-INR
INR: 1.2 (ref 0.8–1.2)
Prothrombin Time: 15.2 seconds (ref 11.4–15.2)

## 2022-10-11 SURGERY — COLONOSCOPY WITH PROPOFOL
Anesthesia: General

## 2022-10-11 MED ORDER — PHENYLEPHRINE HCL (PRESSORS) 10 MG/ML IV SOLN
INTRAVENOUS | Status: DC | PRN
  Administered 2022-10-11 (×6): 160 ug via INTRAVENOUS

## 2022-10-11 MED ORDER — EPHEDRINE 5 MG/ML INJ
INTRAVENOUS | Status: AC
  Filled 2022-10-11: qty 5

## 2022-10-11 MED ORDER — OXYCODONE HCL 5 MG PO TABS: 5.0000 mg | ORAL_TABLET | Freq: Four times a day (QID) | ORAL | Status: DC | PRN

## 2022-10-11 MED ORDER — PROPOFOL 10 MG/ML IV BOLUS
INTRAVENOUS | Status: AC
  Filled 2022-10-11: qty 20

## 2022-10-11 MED ORDER — PANTOPRAZOLE SODIUM 40 MG PO TBEC
40.0000 mg | DELAYED_RELEASE_TABLET | Freq: Two times a day (BID) | ORAL | Status: DC
  Administered 2022-10-11 (×2): 40 mg via ORAL
  Filled 2022-10-11 (×2): qty 1

## 2022-10-11 MED ORDER — IOHEXOL 300 MG/ML  SOLN
100.0000 mL | Freq: Once | INTRAMUSCULAR | Status: AC | PRN
  Administered 2022-10-11: 100 mL via INTRAVENOUS

## 2022-10-11 MED ORDER — LIDOCAINE HCL (CARDIAC) PF 100 MG/5ML IV SOSY
PREFILLED_SYRINGE | INTRAVENOUS | Status: DC | PRN
  Administered 2022-10-11: 60 mg via INTRAVENOUS

## 2022-10-11 MED ORDER — PROPOFOL 10 MG/ML IV BOLUS
INTRAVENOUS | Status: DC | PRN
  Administered 2022-10-11: 90 mg via INTRAVENOUS

## 2022-10-11 MED ORDER — PROPOFOL 500 MG/50ML IV EMUL
INTRAVENOUS | Status: DC | PRN
  Administered 2022-10-11: 150 ug/kg/min via INTRAVENOUS

## 2022-10-11 MED ORDER — IOHEXOL 9 MG/ML PO SOLN
500.0000 mL | ORAL | Status: AC
  Administered 2022-10-11 (×2): 500 mL via ORAL

## 2022-10-11 MED ORDER — PHENYLEPHRINE 80 MCG/ML (10ML) SYRINGE FOR IV PUSH (FOR BLOOD PRESSURE SUPPORT)
PREFILLED_SYRINGE | INTRAVENOUS | Status: AC
  Filled 2022-10-11: qty 10

## 2022-10-11 MED ORDER — EPHEDRINE SULFATE (PRESSORS) 50 MG/ML IJ SOLN
INTRAMUSCULAR | Status: DC | PRN
  Administered 2022-10-11 (×2): 10 mg via INTRAVENOUS

## 2022-10-11 NOTE — Assessment & Plan Note (Signed)
Patient states he takes chronic pain medications.

## 2022-10-11 NOTE — Assessment & Plan Note (Signed)
Hemoglobin was 10 on admission dropped down to 8.2 and up to 9.0.  Upon discharge hemoglobin 8.3.  Recommend checking a hemoglobin as outpatient.

## 2022-10-11 NOTE — Anesthesia Procedure Notes (Signed)
Date/Time: 10/11/2022 9:24 AM  Performed by: Doreen Salvage, CRNAPre-anesthesia Checklist: Patient identified, Emergency Drugs available, Suction available and Patient being monitored Patient Re-evaluated:Patient Re-evaluated prior to induction Oxygen Delivery Method: Nasal cannula Induction Type: IV induction Dental Injury: Teeth and Oropharynx as per pre-operative assessment  Comments: Nasal cannula with etCO2 monitoring

## 2022-10-11 NOTE — Anesthesia Postprocedure Evaluation (Signed)
Anesthesia Post Note  Patient: Alexander Wilkins  Procedure(s) Performed: COLONOSCOPY WITH PROPOFOL  Patient location during evaluation: Endoscopy Anesthesia Type: General Level of consciousness: awake and alert Pain management: pain level controlled Vital Signs Assessment: post-procedure vital signs reviewed and stable Respiratory status: spontaneous breathing, nonlabored ventilation, respiratory function stable and patient connected to nasal cannula oxygen Cardiovascular status: blood pressure returned to baseline and stable Postop Assessment: no apparent nausea or vomiting Anesthetic complications: no   No notable events documented.   Last Vitals:  Vitals:   10/11/22 0806 10/11/22 1020  BP: 106/73 (!) 84/42  Pulse: 74 64  Resp: 16 12  Temp: 36.7 C   SpO2: 99% 100%    Last Pain:  Vitals:   10/11/22 1020  TempSrc:   PainSc: 2                  Arita Miss

## 2022-10-11 NOTE — Hospital Course (Signed)
62 y.o. male with medical history significant of cirrhosis, hepatitis C, alcohol abuse presenting with upper GI bleed.  Patient reports approximately 10 days of black and tarry stools.  Patient was referred from the gastroenterology service in [place].  No fevers or chills.  No nausea or vomiting.  No abdominal pain.  Has had multiple dark black bowel movements every day.  Minimal weakness.  Does admit to drinking at least 1 to 2 pints of liquor a week in addition to 3-4 bottles of wine and occasional beer per week.  Does also use NSAIDs including ibuprofen/Aleve on a regular basis.  Denies any prior history of gastrointestinal bleeding in the past.  No reported prior colonoscopies or endoscopies.  No hemiparesis or confusion.  Intermittent tobacco use as well as intermittent marijuana use. Presented to the ER afebrile, hemodynamically stable.  White count 6.8, hemoglobin 10 with a baseline hemoglobin around 12-14, platelets 170.  Creatinine 9.2, LFTs and T. bili grossly within normal limits.  Dr. Virgina Jock with gastroenterology formally consulted.    EGD showed gastritis and duodenitis. Colonoscopy did not show any active bleeding but there was a stricture which the colonoscope could not be passed through. CT scan showed a long segment circumferential mural thickening involving the mid to distal sigmoid colon may represent muscular hypertrophy but cannot rule out an infiltrative mass, moderate sigmoid diverticulosis without diverticulitis, cholelithiasis.  10/12/2022.  Patient states he feels fine and does not want to stay in the hospital anymore.  He did not want IV iron.  He did not want to talk to the surgeon he wanted to do clean living first to see if things improve.  I told him I could not rule out a cancerous process with this stricture.  Gave him the number for Dr. Dahlia Byes general surgery to follow-up with.

## 2022-10-11 NOTE — Assessment & Plan Note (Addendum)
Suspect from hepatitis C and/or alcohol.

## 2022-10-11 NOTE — Assessment & Plan Note (Signed)
EGD showed duodenitis and gastritis.  Colonoscopy could not be passed through a stricture.  No active blood seen in the colonoscope.  GI did not want to do a capsule endoscopy with the fear that it would be stuck in the stricture.  On 3/17 the patient did not want to stay in the hospital any longer.  Patient's hemoglobin drifted down to 8.3 with IV fluids.  Patient felt well and no further bleeding and normal bowel movement today.  He did not want to stay for IV iron.  Prescribed Protonix 40 mg daily for the morning and advised to get over-the-counter omeprazole for the evening dose.  No NSAIDs.

## 2022-10-11 NOTE — Assessment & Plan Note (Signed)
No signs of withdrawal currently. ?

## 2022-10-11 NOTE — Assessment & Plan Note (Signed)
Prior history of patient being hep C antibody positive per patient Status post course of Epclusa

## 2022-10-11 NOTE — Transfer of Care (Signed)
Immediate Anesthesia Transfer of Care Note  Patient: Alexander Wilkins  Procedure(s) Performed: COLONOSCOPY WITH PROPOFOL  Patient Location: PACU and Endoscopy Unit  Anesthesia Type:General  Level of Consciousness: drowsy  Airway & Oxygen Therapy: Patient Spontanous Breathing  Post-op Assessment: Report given to RN and Post -op Vital signs reviewed and stable  Post vital signs: Reviewed and stable  Last Vitals:  Vitals Value Taken Time  BP 106/95 10/11/22 1020  Temp    Pulse 64 10/11/22 1020  Resp 12 10/11/22 1020  SpO2 100 % 10/11/22 1020    Last Pain:  Vitals:   10/11/22 1020  TempSrc:   PainSc: 2          Complications: No notable events documented.

## 2022-10-11 NOTE — Assessment & Plan Note (Addendum)
GI saw a colon stricture that could not be passed on colonoscopy.  GI recommended outpatient surgical follow-up.  Will get a CT scan of the abdomen pelvis with oral and IV contrast secondary to colon stricture.

## 2022-10-11 NOTE — Anesthesia Preprocedure Evaluation (Signed)
Anesthesia Evaluation  Patient identified by MRN, date of birth, ID band Patient awake    Reviewed: Allergy & Precautions, NPO status , Patient's Chart, lab work & pertinent test results  History of Anesthesia Complications Negative for: history of anesthetic complications  Airway Mallampati: III  TM Distance: >3 FB Neck ROM: Full    Dental  (+) Teeth Intact, Dental Advidsory Given   Pulmonary neg shortness of breath, neg sleep apnea, neg COPD, neg recent URI, Current Smoker and Patient abstained from smoking.   Pulmonary exam normal breath sounds clear to auscultation       Cardiovascular Exercise Tolerance: Good METS(-) hypertension(-) angina (-) CAD and (-) Past MI negative cardio ROS (-) dysrhythmias  Rhythm:Regular Rate:Normal - Systolic murmurs    Neuro/Psych negative neurological ROS  negative psych ROS   GI/Hepatic ,neg GERD  ,,(+) Cirrhosis     substance abuse  alcohol use and marijuana use, Hepatitis -, C  Endo/Other  neg diabetes    Renal/GU negative Renal ROS     Musculoskeletal   Abdominal   Peds  Hematology   Anesthesia Other Findings No past medical history on file.  Reproductive/Obstetrics                              Anesthesia Physical Anesthesia Plan  ASA: 3  Anesthesia Plan: General   Post-op Pain Management:  Regional for Post-op pain and Minimal or no pain anticipated   Induction: Intravenous  PONV Risk Score and Plan: 2 and Propofol infusion, TIVA and Treatment may vary due to age or medical condition  Airway Management Planned: Natural Airway and Nasal Cannula  Additional Equipment: None  Intra-op Plan:   Post-operative Plan:   Informed Consent: I have reviewed the patients History and Physical, chart, labs and discussed the procedure including the risks, benefits and alternatives for the proposed anesthesia with the patient or authorized  representative who has indicated his/her understanding and acceptance.     Dental advisory given  Plan Discussed with: CRNA and Surgeon  Anesthesia Plan Comments: (Discussed risks of anesthesia with patient, including possibility of difficulty with spontaneous ventilation under anesthesia necessitating airway intervention, PONV, and rare risks such as cardiac or respiratory or neurological events, and allergic reactions. Discussed the role of CRNA in patient's perioperative care. Patient understands.)         Anesthesia Quick Evaluation

## 2022-10-11 NOTE — Progress Notes (Signed)
Post GI Care Notes  Patient underwent colonoscopy today.  Prep was fair.  Notably there is a severe stenosis at 25 cm from the anus secondary to diverticulosis.  In order to traverse this the adult colonoscope was exchanged for pediatric colonoscope.  Even this could not traverse the area so an adult gastroscope was used.  I was able to advance to the hepatic flexure.  There is no signs of old or fresh blood within the lumen.  Stool appeared dark brown.  Due to the severe stenosis no plans for video capsule endoscopy to prevent the capsule becoming trapped. Recommend outpatient surgical consult for diverticular stricture. Follow-up with primary gastroenterologist in [place] PPI Protonix 40 mg twice a day for 8 weeks Can discontinue octreotide Suspect that this was a slow bleed from his gastropathy and gastritis as well as duodenitis.  Would continue antibiotics for total of 7 days given cirrhosis and upper GI bleeding source.  Currently on ceftriaxone.  If discharged prior recommend ciprofloxacin 500 mg twice daily to complete the course  If continues to have bleeding issues recommend tagged RBC scan Counseled on cirrhosis and importance of alcohol cessation as well as avoiding nonsteroidals  Continue to monitor H&H with transfusion resuscitation as per primary team Discussed with patient and hospitalist team  Ronne Binning, [facility]

## 2022-10-11 NOTE — Progress Notes (Addendum)
Progress Note   Patient: Alexander Wilkins [id number] DOB: February 19, 1961 DOA: 10/10/2022     0 DOS: the patient was seen and examined on 10/11/2022   Brief hospital course: 62 y.o. male with medical history significant of cirrhosis, hepatitis C, alcohol abuse presenting with upper GI bleed.  Patient reports approximately 10 days of black and tarry stools.  Patient was referred from the gastroenterology service in [place].  No fevers or chills.  No nausea or vomiting.  No abdominal pain.  Has had multiple dark black bowel movements every day.  Minimal weakness.  Does admit to drinking at least 1 to 2 pints of liquor a week in addition to 3-4 bottles of wine and occasional beer per week.  Does also use NSAIDs including ibuprofen/Aleve on a regular basis.  Denies any prior history of gastrointestinal bleeding in the past.  No reported prior colonoscopies or endoscopies.  No hemiparesis or confusion.  Intermittent tobacco use as well as intermittent marijuana use. Presented to the ER afebrile, hemodynamically stable.  White count 6.8, hemoglobin 10 with a baseline hemoglobin around 12-14, platelets 170.  Creatinine 9.2, LFTs and T. bili grossly within normal limits.  Dr. Virgina Jock with gastroenterology formally consulted.    EGD showed gastritis and duodenitis. Colonoscopy did not show any active bleeding but there was a stricture which the colonoscope could not be passed through.   Assessment and Plan: * UGIB (upper gastrointestinal bleed) EGD showed duodenitis and gastritis.  As per GI okay to discontinue octreotide.  Patient on Protonix.  Colonoscopy could not be passed through a stricture.  No active blood seen in the colonoscope.  GI did not want to do a capsule endoscopy with the fear that it would be stuck in the stricture.  Will monitor hemoglobin if further bleeding may consider a tagged red blood cell study.  Acute blood loss anemia Hemoglobin was 10 on admission dropped down to 8.2 and today  9.0.  Continue to watch for further signs of bleeding.  Patient states that his stool was tea colored.   Alcohol abuse No signs of withdrawal currently.  Chronic pain Patient states he takes chronic pain medications.  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  GI recommended outpatient surgical follow-up.  Will get a CT scan of the abdomen pelvis with oral and IV contrast secondary to colon stricture.  Other cirrhosis of liver ([place]) Suspect from hepatitis C and/or alcohol.  Chronic hepatitis C without hepatic coma (HCC) Prior history of patient being hep C antibody positive per patient Status post course of Epclusa         Subjective: Patient seen this morning.  He was passing tea colored stool.  Has history of constipation.  Takes chronic pain medications.  Physical Exam: Vitals:   10/11/22 0806 10/11/22 1020 10/11/22 1037 10/11/22 1050  BP: 106/73 (!) 84/42 105/68 109/84  Pulse: 74 64 71 70  Resp: 16 12 12 14   Temp: 98.1 F (36.7 C)     TempSrc: Oral     SpO2: 99% 100% 100% 100%  Weight:      Height:       Physical Exam HENT:     Head: Normocephalic.     Mouth/Throat:     Pharynx: No oropharyngeal exudate.  Eyes:     General: Lids are normal.     Conjunctiva/sclera: Conjunctivae normal.  Cardiovascular:     Rate and Rhythm: Normal rate  and regular rhythm.     Heart sounds: Normal heart sounds, S1 normal and S2 normal.  Pulmonary:     Breath sounds: No decreased breath sounds, wheezing, rhonchi or rales.  Abdominal:     Palpations: Abdomen is soft.     Tenderness: There is no abdominal tenderness.  Musculoskeletal:     Right lower leg: No swelling.     Left lower leg: No swelling.  Skin:    General: Skin is warm.     Findings: No rash.  Neurological:     Mental Status: He is alert and oriented to person, place, and time.     Data Reviewed: Last hemoglobin 9.0, platelet count  153, white blood cell count 5.6, creatinine 0.72   Disposition: Status is: Changed to inpatient. Continue to watch for any further bleeding.  No bleeding in the lower colon but colonoscope could not pass through a stricture.  Planned Discharge Destination: Home    Time spent: 28 minutes  Author: Loletha Grayer, MD 10/11/2022 1:44 PM  For on call review [email or web address].

## 2022-10-11 NOTE — Op Note (Signed)
[facility] Gastroenterology Patient Name: Alexander Wilkins Procedure Date: 10/11/2022 8:44 AM MRN: [id number] Account #: [id number] Date of Birth: Jan 23, 1961 Admit Type: Inpatient Age: 62 Room: [facility] ENDO ROOM 4 Gender: Male Note Status: Finalized Instrument Name: Colonoscope [id number] Procedure:             Colonoscopy Indications:           Melena Providers:             Rueben Bash, DO Referring MD:          [place]:             Monitored Anesthesia Care Complications:         No immediate complications. Estimated blood loss: None. Procedure:             Pre-Anesthesia Assessment:                        - Prior to the procedure, a History and Physical was                         performed, and patient medications and allergies were                         reviewed. The patient is competent. The risks and                         benefits of the procedure and the sedation options and                         risks were discussed with the patient. All questions                         were answered and informed consent was obtained.                         Patient identification and proposed procedure were                         verified by the physician, the nurse, the anesthetist                         and the technician in the endoscopy suite. Mental                         Status Examination: alert and oriented. Airway                         Examination: normal oropharyngeal airway and neck                         mobility. Respiratory Examination: clear to                         auscultation. CV Examination: RRR, no murmurs, no S3                         or S4. Prophylactic Antibiotics: The patient does not  require prophylactic antibiotics. Prior                         Anticoagulants: The patient has taken no anticoagulant                         or antiplatelet agents. ASA Grade Assessment: III -  A                         patient with severe systemic disease. After reviewing                         the risks and benefits, the patient was deemed in                         satisfactory condition to undergo the procedure. The                         anesthesia plan was to use monitored anesthesia care                         (MAC). Immediately prior to administration of                         medications, the patient was re-assessed for adequacy                         to receive sedatives. The heart rate, respiratory                         rate, oxygen saturations, blood pressure, adequacy of                         pulmonary ventilation, and response to care were                         monitored throughout the procedure. The physical                         status of the patient was re-assessed after the                         procedure.                        After obtaining informed consent, the colonoscope was                         passed under direct vision. Throughout the procedure,                         the patient's blood pressure, pulse, and oxygen                         saturations were monitored continuously. The                         Colonoscope was introduced through the anus and  advanced to the the hepatic flexure. The colonoscopy                         was unusually difficult due to multiple diverticula in                         the colon, unsatisfactory bowel prep and a redundant                         colon. Successful completion of the procedure was                         aided by changing the patient to a prone position,                         withdrawing the scope and replacing with the adult                         endoscope, withdrawing the scope and replacing with                         the pediatric colonoscope, straightening and                         shortening the scope to obtain bowel loop reduction,                          applying abdominal pressure and lavage. The patient                         tolerated the procedure well. The quality of the bowel                         preparation was fair. The hepatic flexure and rectum                         were photographed. Findings:      The perianal and digital rectal examinations were normal. Pertinent       negatives include normal sphincter tone.      Multiple small-mouthed diverticula were found in the recto-sigmoid       colon, sigmoid colon, descending colon and transverse colon. Estimated       blood loss: none.      A benign-appearing, intrinsic severe stenosis measuring 2 cm (in length)       x 1.1 cm (inner diameter) was found at 25 cm proximal to the anus and       was traversed. Traversed after switching from adult colonoscope to peds       colonoscope down to gastroscope. Unable to reach beyond Hepatic flexure.       Stoolw as mostly dark brown. No blood/melena noted.      Non-bleeding internal hemorrhoids were found during retroflexion. The       hemorrhoids were Grade I (internal hemorrhoids that do not prolapse).       Estimated blood loss: none. Impression:            - Preparation of the colon was fair.                        -  Diverticulosis in the recto-sigmoid colon, in the                         sigmoid colon, in the descending colon and in the                         transverse colon.                        - Stricture at 25 cm proximal to the anus.                        - Non-bleeding internal hemorrhoids.                        - No specimens collected. Recommendation:        - Return patient to hospital ward for ongoing care.                        - Advance diet as tolerated.                        - Continue twice a day ppi for 8 weeks                        continue antibiotics for total of 7 days (currently on                         rocephin). can transition to ciprofloxacin 500mg  bid                         upon  discharge.                        Follow up with primary GI as outpatient to consider                         repeat colonoscopy.                        Outpatient referral to surgery given severe sigmoid                         stenosis from diverticular disease                        - Continue present medications.                        - The findings and recommendations were discussed with                         the patient.                        - The findings and recommendations were discussed with                         the referring physician. Procedure Code(s):     --- Professional ---  [id number], 53, Colonoscopy, flexible; diagnostic,                         including collection of specimen(s) by brushing or                         washing, when performed (separate procedure) Diagnosis Code(s):     --- Professional ---                        K64.0, First degree hemorrhoids                        K56.699, Other intestinal obstruction unspecified as                         to partial versus complete obstruction                        K92.1, Melena (includes Hematochezia)                        K57.30, Diverticulosis of large intestine without                         perforation or abscess without bleeding CPT copyright 2022 American [employer]. All rights reserved. The codes documented in this report are preliminary and upon coder review may  be revised to meet current compliance requirements. Attending Participation:      I personally performed the entire procedure. Volney American, DO Annamaria Helling DO, DO 10/11/2022 10:28:35 AM This report has been signed electronically. Number of Addenda: 0 Note Initiated On: 10/11/2022 8:44 AM Total Procedure Duration: 0 hours 44 minutes 25 seconds  Estimated Blood Loss:  Estimated blood loss: none.      [facility]

## 2022-10-12 DIAGNOSIS — G8929 Other chronic pain: Secondary | ICD-10-CM

## 2022-10-12 DIAGNOSIS — K56699 Other intestinal obstruction unspecified as to partial versus complete obstruction: Secondary | ICD-10-CM | POA: Diagnosis not present

## 2022-10-12 DIAGNOSIS — K922 Gastrointestinal hemorrhage, unspecified: Secondary | ICD-10-CM | POA: Diagnosis not present

## 2022-10-12 DIAGNOSIS — F1010 Alcohol abuse, uncomplicated: Secondary | ICD-10-CM | POA: Diagnosis not present

## 2022-10-12 DIAGNOSIS — D62 Acute posthemorrhagic anemia: Secondary | ICD-10-CM | POA: Diagnosis not present

## 2022-10-12 LAB — CBC
HCT: 24.1 % — ABNORMAL LOW (ref 39.0–52.0)
Hemoglobin: 8.3 g/dL — ABNORMAL LOW (ref 13.0–17.0)
MCH: 36.2 pg — ABNORMAL HIGH (ref 26.0–34.0)
MCHC: 34.4 g/dL (ref 30.0–36.0)
MCV: 105.2 fL — ABNORMAL HIGH (ref 80.0–100.0)
Platelets: 134 10*3/uL — ABNORMAL LOW (ref 150–400)
RBC: 2.29 MIL/uL — ABNORMAL LOW (ref 4.22–5.81)
RDW: 14.6 % (ref 11.5–15.5)
WBC: 4.8 10*3/uL (ref 4.0–10.5)
nRBC: 0 % (ref 0.0–0.2)

## 2022-10-12 LAB — BASIC METABOLIC PANEL
Anion gap: 6 (ref 5–15)
BUN: 8 mg/dL (ref 8–23)
CO2: 22 mmol/L (ref 22–32)
Calcium: 8 mg/dL — ABNORMAL LOW (ref 8.9–10.3)
Chloride: 107 mmol/L (ref 98–111)
Creatinine, Ser: 0.74 mg/dL (ref 0.61–1.24)
GFR, Estimated: >60 mL/min (ref >60)
Glucose, Bld: 103 mg/dL — ABNORMAL HIGH (ref 70–99)
Potassium: 3.7 mmol/L (ref 3.5–5.1)
Sodium: 135 mmol/L (ref 135–145)

## 2022-10-12 MED ORDER — VITAMIN B-1 100 MG PO TABS: 100.0000 mg | ORAL_TABLET | Freq: Every day | ORAL | 0 refills | Status: AC

## 2022-10-12 MED ORDER — CIPROFLOXACIN HCL 500 MG PO TABS: 500.0000 mg | ORAL_TABLET | Freq: Two times a day (BID) | ORAL | 0 refills | Status: AC

## 2022-10-12 MED ORDER — ADULT MULTIVITAMIN W/MINERALS CH: 1.0000 | ORAL_TABLET | Freq: Every day | ORAL | Status: AC

## 2022-10-12 MED ORDER — POLYETHYLENE GLYCOL 3350 17 G PO PACK: 17.0000 g | PACK | Freq: Every day | ORAL | 0 refills | Status: AC

## 2022-10-12 MED ORDER — VITAMIN B-12 1000 MCG PO TABS: 1000.0000 ug | ORAL_TABLET | Freq: Every day | ORAL | 0 refills | Status: AC

## 2022-10-12 MED ORDER — FOLIC ACID 1 MG PO TABS: 1.0000 mg | ORAL_TABLET | Freq: Every day | ORAL | 0 refills | Status: AC

## 2022-10-12 MED ORDER — PANTOPRAZOLE SODIUM 40 MG PO TBEC: 40.0000 mg | DELAYED_RELEASE_TABLET | Freq: Every day | ORAL | 0 refills | Status: AC

## 2022-10-12 NOTE — Discharge Instructions (Signed)
Use protonix 40 mg po daily in the am Buy over the counter omeprazole 40mg  and take in the evening.

## 2022-10-12 NOTE — Progress Notes (Signed)
GI Inpatient Follow-up Note  Subjective:  Patient seen in follow-up for UGIB. No acute overnight events. He denies any further episodes of melena. He denies any abdominal pain. He is requesting to go home today. No new complaints.   Scheduled Inpatient Medications:   folic acid  1 mg Oral Daily   multivitamin with minerals  1 tablet Oral Daily   pantoprazole  40 mg Oral BID   thiamine  100 mg Oral Daily   Or   thiamine  100 mg Intravenous Daily    Continuous Inpatient Infusions:    cefTRIAXone (ROCEPHIN)  IV 1 g (10/11/22 1513)    PRN Inpatient Medications:  LORazepam **OR** LORazepam, ondansetron **OR** ondansetron (ZOFRAN) IV, oxyCODONE  Review of Systems: Constitutional: Weight is stable.  Eyes: No changes in vision. ENT: No oral lesions, sore throat.  GI: see HPI.  Heme/Lymph: No easy bruising.  CV: No chest pain.  GU: No hematuria.  Integumentary: No rashes.  Neuro: No headaches.  Psych: No depression/anxiety.  Endocrine: No heat/cold intolerance.  Allergic/Immunologic: No urticaria.  Resp: No cough, SOB.  Musculoskeletal: No joint swelling.    Physical Examination: BP 110/70 (BP Location: Left Arm)   Pulse 76   Temp 97.6 F (36.4 C)   Resp 12   Ht 6\' 2"  (1.88 m)   Wt 95.3 kg   SpO2 99%   BMI 26.96 kg/m  Gen: NAD, alert and oriented x 4 HEENT: PEERLA, EOMI, Neck: supple, no JVD or thyromegaly Chest: CTA bilaterally, no wheezes, crackles, or other adventitious sounds CV: RRR, no m/g/c/r Abd: soft, NT, ND, +BS in all four quadrants; no HSM, guarding, ridigity, or rebound tenderness Ext: no edema, well perfused with 2+ pulses, Skin: no rash or lesions noted Lymph: no LAD  Data: Lab Results  Component Value Date   WBC 4.8 10/12/2022   HGB 8.3 (L) 10/12/2022   HCT 24.1 (L) 10/12/2022   MCV 105.2 (H) 10/12/2022   PLT 134 (L) 10/12/2022   Recent Labs  Lab 10/10/22 2045 10/11/22 0345 10/12/22 0335  HGB 8.2* 9.0* 8.3*   Lab Results   Component Value Date   NA 135 10/12/2022   K 3.7 10/12/2022   CL 107 10/12/2022   CO2 22 10/12/2022   BUN 8 10/12/2022   CREATININE 0.74 10/12/2022   Lab Results  Component Value Date   ALT 12 10/11/2022   AST 29 10/11/2022   ALKPHOS 50 10/11/2022   BILITOT 0.6 10/11/2022   Recent Labs  Lab 10/11/22 0345  INR 1.2   CSY 10/11/2022 - fair colon prep, diverticulosis in recto-sigmoid colon, sigmoid colon, descending colon, and transverse colon, diverticular stricture at 25 cm, non-bleeding internal hemorrhoids  EGD - 10/10/2022 - regular Z-line, gastritis, duodenitis  Assessment/Plan:  62 y/o Caucasian male with a PMH of chronic hepatitis C s/p antiviral treatment, cirrhosis of the liver, arthritis, chronic pain syndrome, and EtOH presented to the [facility] ED 3/15 for chief complaint of melena and acute blood loss anemia. He is s/p EGD and colonoscopy this hospitalization.   UGIB - s/p EGD (3/15) and CSY (3/16) this hospitalization which showed no sources of active bleeding. Suspect patient has chronic GI blood loss from gastritis and duodenitis. H&H stable with no signs of overt GI bleeding. No plans for VCE due to colonic stricture.   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 - surgical consult pending   Compensated cirrhosis of the liver   History of chronic HCV s/p antiviral treatment   EtOH  abuse  Chronic pain syndrome   Recommendations:  - Reviewed EGD procedure report with patient in room today - No signs of recurrent GI bleeding - H&H stable - Recommend outpatient surgical consult for diverticular stricture - Follow-up with primary gastroenterologist in [place] - Discussed importance of complete alcohol cessation to prevent episodes of hepatic decompensation  - Avoid NSAIDs - Ciprofloxacin 500 mg PO BID for additional 5 days to complete 7-day course given hx of cirrhosis and UGIB bleeding source - GI will sign off at this time and anticipate d/c from hospital today. I  discussed POC with Dr. Leslye Peer.    Please call with questions or concerns.   Octavia Bruckner, PA-C [facility] Gastroenterology [phone]

## 2022-10-12 NOTE — Discharge Summary (Signed)
Physician Discharge Summary   Patient: Alexander Wilkins MRN: [id number] DOB: 1961/04/16  Admit date:     10/10/2022  Discharge date: 10/12/22  Discharge Physician: Loletha Grayer   PCP: [facility]   Recommendations at discharge:   Follow-up PCP 5 days Follow-up with your gastroenterologist Follow-up Dr. 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 male with medical history significant of cirrhosis, hepatitis C, alcohol abuse presenting with upper GI bleed.  Patient reports approximately 10 days of black and tarry stools.  Patient was referred from the gastroenterology service in [place].  No fevers or chills.  No nausea or vomiting.  No abdominal pain.  Has had multiple dark black bowel movements every day.  Minimal weakness.  Does admit to drinking at least 1 to 2 pints of liquor a week in addition to 3-4 bottles of wine and occasional beer per week.  Does also use NSAIDs including ibuprofen/Aleve on a regular basis.  Denies any prior history of gastrointestinal bleeding in the past.  No reported prior colonoscopies or endoscopies.  No hemiparesis or confusion.  Intermittent tobacco use as well as intermittent marijuana use. Presented to the ER afebrile, hemodynamically stable.  White count 6.8, hemoglobin 10 with a baseline hemoglobin around 12-14, platelets 170.  Creatinine 9.2, LFTs and T. bili grossly within normal limits.  Dr. Virgina Jock with gastroenterology formally consulted.    EGD showed gastritis and duodenitis. Colonoscopy did not show any active bleeding but there was a stricture which the colonoscope could not be passed through. CT scan showed a long segment  circumferential mural thickening involving the mid to distal sigmoid colon may represent muscular hypertrophy but cannot rule out an infiltrative mass, moderate sigmoid diverticulosis without diverticulitis, cholelithiasis.  10/12/2022.  Patient states he feels fine and does not want to stay in the hospital anymore.  He did not want IV iron.  He did not want to talk to the surgeon he wanted to do clean living first to see if things improve.  I told him I could not rule out a cancerous process with this stricture.  Gave him the number for Dr. Dahlia Byes general surgery to follow-up with.    Assessment and Plan: * UGIB (upper gastrointestinal bleed) EGD showed duodenitis and gastritis.  Colonoscopy could not be passed through a stricture.  No active blood seen in the colonoscope.  GI did not want to do a capsule endoscopy with the fear that it would be stuck in the stricture.  On 3/17 the patient did not want to stay in the hospital any longer.  Patient's hemoglobin drifted down to 8.3 with IV fluids.  Patient felt well and no further bleeding and normal bowel movement today.  He did not want to stay for IV iron.  Prescribed Protonix 40 mg daily for the morning and advised to get over-the-counter omeprazole for the evening dose.  No NSAIDs.  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  CT scan of the abdomen did show a long segment of circumferential mural thickening involving the mid to distal sigmoid colon.  While this may reflect sequela  of muscular hypertrophy, an infiltrative mass is difficult to exclude.  Dr. Dahlia Byes will try to follow him up as outpatient.  MiraLAX prescribed.  Acute blood loss anemia Hemoglobin was 10 on admission dropped down to 8.2 and up to 9.0.  Upon discharge hemoglobin 8.3.  Recommend checking a hemoglobin as outpatient.   Alcohol abuse No signs of withdrawal currently.  Advised he must stop  drinking.  Chronic pain Patient states he takes chronic pain medications.  He states he will stop his pain medications.  Other cirrhosis of liver ([place]) Of note are CT scan of the abdomen with contrast commented that the liver was unremarkable so not sure if he has cirrhosis at this point.  Chronic hepatitis C without hepatic coma (HCC) Prior history of patient being hep C antibody positive per patient Status post course of Epclusa          Consultants: Gastroenterology Procedures performed: EGD and colonoscopy Disposition: Home Diet recommendation:  Regular diet DISCHARGE MEDICATION: Allergies as of 10/12/2022       Reactions   Tramadol Hives        Medication List     TAKE these medications    ciprofloxacin 500 MG tablet Commonly known as: Cipro Take 1 tablet (500 mg total) by mouth 2 (two) times daily for 5 days.   cyanocobalamin 1000 MCG tablet Commonly known as: VITAMIN B12 Take 1 tablet (1,000 mcg total) by mouth daily.   folic acid 1 MG tablet Commonly known as: FOLVITE Take 1 tablet (1 mg total) by mouth daily.   multivitamin with minerals Tabs tablet Take 1 tablet by mouth daily.   pantoprazole 40 MG tablet Commonly known as: PROTONIX Take 1 tablet (40 mg total) by mouth daily.   polyethylene glycol 17 g packet Commonly known as: MIRALAX / GLYCOLAX Take 17 g by mouth daily.   tamsulosin 0.4 MG Caps capsule Commonly known as: FLOMAX Take 0.4 mg by mouth daily.   thiamine 100 MG tablet Commonly known as: Vitamin B-1 Take 1 tablet (100 mg total) by mouth daily.        Follow-up Information     [facility] Follow up in 5 day(s).   Contact information: [address] [place] [address] [phone]         Jules Husbands, MD Follow up in 2 week(s).   Specialty: General Surgery Why: colonic stricture Contact information: [address] [place] [address] [phone]                 Discharge Exam: Danley Danker Weights   10/10/22 1217  Weight: 95.3 kg   Physical Exam HENT:     Head: Normocephalic.     Mouth/Throat:     Pharynx: No oropharyngeal exudate.  Eyes:     General: Lids are normal.     Conjunctiva/sclera: Conjunctivae normal.  Cardiovascular:     Rate and Rhythm: Normal rate and regular rhythm.     Heart sounds: Normal heart sounds, S1 normal and S2 normal.  Pulmonary:     Breath sounds: No decreased breath sounds, wheezing, rhonchi or rales.  Abdominal:     Palpations: Abdomen is soft.     Tenderness: There is no abdominal tenderness.  Musculoskeletal:     Right lower leg: No swelling.     Left lower leg: No swelling.  Skin:    General: Skin is warm.     Findings: No rash.  Neurological:  Mental Status: He is alert and oriented to person, place, and time.      Condition at discharge: stable  The results of significant diagnostics from this hospitalization (including imaging, microbiology, ancillary and laboratory) are listed below for reference.   Imaging Studies: CT ABDOMEN PELVIS W CONTRAST  Result Date: 10/11/2022 CLINICAL DATA:  Colonic stricture EXAM: CT ABDOMEN AND PELVIS WITH CONTRAST TECHNIQUE: Multidetector CT imaging of the abdomen and pelvis was performed using the standard protocol following bolus administration of intravenous contrast. RADIATION DOSE REDUCTION: This exam was performed according to the departmental dose-optimization program which includes automated exposure control, adjustment of the mA and/or kV according to patient size and/or use of iterative reconstruction technique. CONTRAST:  134mL OMNIPAQUE IOHEXOL 300 MG/ML  SOLN COMPARISON:  01/03/2005 FINDINGS: Lower chest: No acute abnormality. Hepatobiliary: Cholelithiasis without pericholecystic inflammatory change. Liver unremarkable. No intra or extrahepatic biliary ductal dilation. Pancreas: Unremarkable Spleen: Unremarkable Adrenals/Urinary Tract: Adrenal  glands are unremarkable. Kidneys are normal, without renal calculi, focal lesion, or hydronephrosis. Bladder is unremarkable. Stomach/Bowel: There is long segment circumferential mural thickening involving the mid to distal sigmoid colon best seen on axial image # [id number], series # 2. While this may reflect the sequela of muscular hypertrophy, this is not well assessed on this examination and an infiltrative mass is difficult to exclude. Superimposed moderate sigmoid diverticulosis without superimposed acute inflammatory change. The stomach, small bowel, and large bowel are otherwise unremarkable. No free intraperitoneal gas fluid. Vascular/Lymphatic: Aortic atherosclerosis. No enlarged abdominal or pelvic lymph nodes. Reproductive: Prostate is unremarkable. Other: No abdominal wall hernia or abnormality. No abdominopelvic ascites. Musculoskeletal: Osseous structures are age-appropriate. No acute bone abnormality. No lytic or blastic bone lesion. IMPRESSION: 1. Long segment circumferential mural thickening involving the mid to distal sigmoid colon. While this may reflect the sequela of muscular hypertrophy, this is not well assessed on this examination and an infiltrative mass is difficult to exclude. Correlation with recent endoscopic findings is recommended. 2. Moderate sigmoid diverticulosis without superimposed acute inflammatory change. 3. Cholelithiasis. Aortic Atherosclerosis (ICD10-I70.0). Electronically Signed   By: Fidela Salisbury M.D.   On: 10/11/2022 21:48      Labs: CBC: Recent Labs  Lab 10/10/22 1220 10/10/22 2045 10/11/22 0345 10/12/22 0335  WBC 6.8  --  5.6 4.8  HGB 10.0* 8.2* 9.0* 8.3*  HCT 29.5* 24.5* 26.8* 24.1*  MCV 105.0*  --  103.9* 105.2*  PLT 169  --  153 [id number]*   Basic Metabolic Panel: Recent Labs  Lab 10/10/22 1220 10/11/22 0345 10/12/22 0335  NA 137 135 135  K 4.5 4.1 3.7  CL 104 106 107  CO2 25 22 22   GLUCOSE 102* 126* 103*  BUN 15 10 8   CREATININE 0.75 0.72 0.74   CALCIUM 9.2 8.2* 8.0*   Liver Function Tests: Recent Labs  Lab 10/10/22 1220 10/11/22 0345  AST 28 29  ALT 12 12  ALKPHOS 63 50  BILITOT 0.7 0.6  PROT 8.3* 7.5  ALBUMIN 3.8 3.3*     Discharge time spent: greater than 30 minutes. Case discussed with gastroenterology and general surgery.  Signed: Loletha Grayer, MD [facility] 10/12/2022

## 2022-10-13 ENCOUNTER — Encounter: Payer: Self-pay | Admitting: Gastroenterology

## 2022-10-13 LAB — HCV RNA QUANT: HCV Quantitative: NOT DETECTED IU/mL (ref >50)

## 2022-10-14 ENCOUNTER — Encounter: Payer: Self-pay | Admitting: *Deleted

## 2022-10-20 ENCOUNTER — Ambulatory Visit: Payer: 59 | Admitting: Surgery

## 2022-11-03 ENCOUNTER — Encounter: Payer: Self-pay | Admitting: Surgery

## 2022-11-03 ENCOUNTER — Ambulatory Visit: Payer: 59 | Admitting: Surgery

## 2022-11-03 VITALS — BP 133/79 | HR 98 | Temp 98.1°F | Ht 74.0 in | Wt 203.8 lb

## 2022-11-03 DIAGNOSIS — K56699 Other intestinal obstruction unspecified as to partial versus complete obstruction: Secondary | ICD-10-CM

## 2022-11-03 MED ORDER — METRONIDAZOLE 500 MG PO TABS: 500.0000 mg | ORAL_TABLET | Freq: Three times a day (TID) | ORAL | 0 refills | Status: AC

## 2022-11-03 MED ORDER — CIPROFLOXACIN HCL 500 MG PO TABS: 500.0000 mg | ORAL_TABLET | Freq: Two times a day (BID) | ORAL | 0 refills | Status: AC

## 2022-11-03 NOTE — Patient Instructions (Addendum)
We will prescribe you Cipro and Flagyl antibiotics. Take all of the antibiotics. Keep up with taking the Magnesium Citrate.   We will have you follow up with Dr Timothy Lasso at GI for a Sigmoidoscopy to mark the area that needs to be removed. They will call you about this.   We will have you follow up here in a couple of weeks to discuss surgery with Dr Everlene Farrier.  We would like you to increase your protein intake. You can drink protein shakes for this.    Diverticulitis  Diverticulitis is when small pouches in your colon get infected or swollen. This causes pain in your belly (abdomen) and watery poop (diarrhea). The small pouches are called diverticula. They may form if you have a condition called diverticulosis. What are the causes? You may get this condition if poop (stool) gets trapped in the pouches in your colon. The poop lets germs (bacteria) grow. This causes an infection. What increases the risk? You are more likely to get this condition if you have small pouches in your colon. You are also more likely to get it if: You are overweight or very overweight (obese). You do not exercise enough. You drink alcohol. You smoke. You eat a lot of red meat, like beef, pork, or lamb. You do not eat enough fiber. You are older than 62 years of age. What are the signs or symptoms? Pain in your belly. Pain is often on the left side, but it may be felt in other spots too. Fever and chills. Feeling like you may vomit. Vomiting. Having cramps. Feeling full. Changes in how often you poop. Blood in your poop. How is this treated? Most cases are treated at home. You may be told to: Take over-the-counter pain medicines. Only eat and drink clear liquids. Take antibiotics. Rest. Very bad cases may need to be treated at a hospital. Treatment may include: Not eating or drinking. Taking pain medicines. Getting antibiotics through an IV tube. Getting fluid and food through an IV tube. Having  surgery. When you are feeling better, you may need to have a test to look at your colon (colonoscopy). Follow these instructions at home: Medicines Take over-the-counter and prescription medicines only as told by your doctor. These include: Fiber pills. Probiotics. Medicines to make your poop soft (stool softeners). If you were prescribed antibiotics, take them as told by your doctor. Do not stop taking them even if you start to feel better. Ask your doctor if you should avoid driving or using machines while you are taking your medicine. Eating and drinking  Follow the diet told by your doctor. You may need to only eat and drink liquids. When you feel better, you may be able to eat more foods. You may also be told to eat a lot of fiber. Fiber helps you poop. Foods with fiber include berries, beans, lentils, and green vegetables. Try not to eat red meat. General instructions Do not smoke or use any products that contain nicotine or tobacco. If you need help quitting, ask your doctor. Exercise 3 or more times a week. Try to go for 30 minutes each time. Exercise enough to sweat and make your heart beat faster. Contact a doctor if: Your pain gets worse. You are not pooping like normal. Your symptoms do not get better. Your symptoms get worse very fast. You have a fever. You vomit more than one time. You have poop that is: Bloody. Black. Tarry. This information is not intended to replace advice  given to you by your health care provider. Make sure you discuss any questions you have with your health care provider. Document Revised: 04/10/2022 Document Reviewed: 04/10/2022 Elsevier Patient Education  2023 [employer].

## 2022-11-05 ENCOUNTER — Encounter: Payer: Self-pay | Admitting: Gastroenterology

## 2022-11-05 NOTE — Progress Notes (Signed)
Patient ID: Alexander Wilkins, male   DOB: 05/08/1961, 62 y.o.   MRN: [id number]  HPI Alexander Wilkins is a 62 y.o. male seen in consultation at the request of Dr. Renae Gloss.  He was recently hospitalized to presumed similarly was related to diverticular disease.  He does have a significant history of hepatitis C,  ( s/p course of Epclusa) alcohol abuse but has been sober for the last 6 weeks or so.  He did endorse some melena that has subsided.  He also reports that his bowel movements have become very thin and ""powder like" that puff away after flushing toilet.  Recent visit to [facility] due to abdominal pain, At that time included a CT scan showing evidence of diverticulitis. Have also a CT scan recently that have personally reviewed showing evidence of a long mural thickening of the distal sigmoid colon consistent with diverticular stricture.  At that time he had a attempted colonoscopy by Dr. Timothy Lasso showing the stricture was unable to be transversed with the pediatric scope. He has signed AMA before INR was 1.2,.  CMP is completely normal, BC is normal except mild anemia of 12.6, with a count of 162 HPI  Past Medical History:  Diagnosis Date   Arthritis    Chronic hepatitis C    Genotype Ia   Cirrhosis     Past Surgical History:  Procedure Laterality Date   COLONOSCOPY WITH PROPOFOL N/A 10/11/2022   Procedure: COLONOSCOPY WITH PROPOFOL;  Surgeon: Jaynie Collins, DO;  Location[facility] ENDOSCOPY;  Service: Gastroenterology;  Laterality: N/A;   ESOPHAGOGASTRODUODENOSCOPY (EGD) WITH PROPOFOL N/A 10/10/2022   Procedure: ESOPHAGOGASTRODUODENOSCOPY (EGD) WITH PROPOFOL;  Surgeon: Jaynie Collins, DO;  Location: [facility] ENDOSCOPY;  Service: Gastroenterology;  Laterality: N/A;   FRACTURE SURGERY     KNEE SURGERY Right 1974   ORIF ELBOW FRACTURE Right 08/30/2020   Procedure: OPEN REDUCTION INTERNAL FIXATION (ORIF) ELBOW/OLECRANON FRACTURE;  Surgeon: Kennedy Bucker, MD;  Location: [facility] ORS;   Service: Orthopedics;  Laterality: Right;   WRIST SURGERY Left     Family History  Problem Relation Age of Onset   Colon cancer Neg Hx     Social History Social History   Tobacco Use   Smoking status: Some Days    Types: Cigars    Passive exposure: Past   Smokeless tobacco: Never  Vaping Use   Vaping Use: Never used  Substance Use Topics   Alcohol use: Yes    Comment: 2-3 bourbon and water, glass of wine, or beer each night. 2 glasses of wine in the last 4 weeks (01/17/22)   Drug use: Yes    Frequency: 7.0 times per week    Types: Marijuana    Comment: daily    Allergies  Allergen Reactions   Tramadol Hives    Current Outpatient Medications  Medication Sig Dispense Refill   ciprofloxacin (CIPRO) 500 MG tablet Take 1 tablet (500 mg total) by mouth 2 (two) times daily for 10 days. 20 tablet 0   folic acid (FOLVITE) 1 MG tablet Take 1 tablet (1 mg total) by mouth daily. 30 tablet 0   metroNIDAZOLE (FLAGYL) 500 MG tablet Take 1 tablet (500 mg total) by mouth 3 (three) times daily for 10 days. 30 tablet 0   Multiple Vitamin (MULTIVITAMIN WITH MINERALS) TABS tablet Take 1 tablet by mouth daily.     tamsulosin (FLOMAX) 0.4 MG CAPS capsule Take 0.4 mg by mouth daily.     cyanocobalamin (VITAMIN B12) 1000 MCG tablet  Take 1 tablet (1,000 mcg total) by mouth daily. (Patient not taking: Reported on 11/03/2022) 30 tablet 0   pantoprazole (PROTONIX) 40 MG tablet Take 1 tablet (40 mg total) by mouth daily. (Patient not taking: Reported on 11/03/2022) 30 tablet 0   polyethylene glycol (MIRALAX / GLYCOLAX) 17 g packet Take 17 g by mouth daily. (Patient not taking: Reported on 11/03/2022) 30 each 0   thiamine (VITAMIN B-1) 100 MG tablet Take 1 tablet (100 mg total) by mouth daily. (Patient not taking: Reported on 11/03/2022) 30 tablet 0   No current facility-administered medications for this visit.     Review of Systems Full ROS  was asked and was negative except for the information on the  HPI  Physical Exam Blood pressure 133/79, pulse 98, temperature 98.1 F (36.7 C), height 6\' 2"  (1.88 m), weight 203 lb 12.8 oz (92.4 kg), SpO2 99 %. CONSTITUTIONAL: NAD. EYES: Pupils are equal, round, Sclera are non-icteric. EARS, NOSE, MOUTH AND THROAT: The oropharynx is clear. The oral mucosa is pink and moist. Hearing is intact to voice. LYMPH NODES:  Lymph nodes in the neck are normal. RESPIRATORY:  Lungs are clear. There is normal respiratory effort, with equal breath sounds bilaterally, and without pathologic use of accessory muscles. CARDIOVASCULAR: Heart is regular without murmurs, gallops, or rubs. GI: The abdomen is  soft, nontender, and nondistended. There are no palpable masses. There is no hepatosplenomegaly. There are normal bowel sounds, no ascites, no gross evidence of caput medusa GU: Rectal deferred.   MUSCULOSKELETAL: Normal muscle strength and tone. No cyanosis or edema.   SKIN: Turgor is good and there are no pathologic skin lesions or ulcers. NEUROLOGIC: Motor and sensation is grossly normal. Cranial nerves are grossly intact. PSYCH:  Oriented to person, place and time. Affect is normal.  Data Reviewed  I have personally reviewed the patient's imaging, laboratory findings and medical records.    Assessment/Plan Mr Mccarville is 62 year old male with a diverticular stricture causing significant symptoms that are progressing.  Discussed with the patient in detail and I definitely recommend sigmoid colectomy the sooner the better.  I have personally discussed the case with Dr. Timothy Lasso and he will place tattoo distal to the stricture to be able to make sure I we will address the distal stricture.  I will see him back shortly after he completes his sigmoidoscopy.  We talked in detail about the natural history of diverticulitis and the strictures.  He does have some history of hepatitis C but no evidence of cirrhosis on CT or laboratory data. He is specifically talk about robotic  sigmoid colectomy and the potential need for diverting ostomy.  Risks, benefits and possible implications.  He seems to understand this. I Spent 55 minutes in this encounter including reviewing records, counseling the patient, placing orders and performing appropriate documentation Copy Of this report was sent to the referring provider Sterling Big, MD FACS General Surgeon 11/05/2022, 1:46 PM

## 2022-11-06 ENCOUNTER — Other Ambulatory Visit: Payer: Self-pay

## 2022-11-06 ENCOUNTER — Emergency Department
Admission: EM | Admit: 2022-11-06 | Discharge: 2022-11-06 | Disposition: A | Discharge status: Home / Self Care | Payer: 59 | Attending: Emergency Medicine | Admitting: Emergency Medicine

## 2022-11-06 ENCOUNTER — Ambulatory Visit: Admission: RE | Admit: 2022-11-06 | Payer: 59 | Source: Home / Self Care | Admitting: Gastroenterology

## 2022-11-06 DIAGNOSIS — R509 Fever, unspecified: Secondary | ICD-10-CM | POA: Insufficient documentation

## 2022-11-06 DIAGNOSIS — M791 Myalgia, unspecified site: Secondary | ICD-10-CM

## 2022-11-06 DIAGNOSIS — Z20822 Contact with and (suspected) exposure to covid-19: Secondary | ICD-10-CM | POA: Diagnosis not present

## 2022-11-06 LAB — RESP PANEL BY RT-PCR (RSV, FLU A&B, COVID)  RVPGX2
Influenza A by PCR: NEGATIVE
Influenza B by PCR: NEGATIVE
Resp Syncytial Virus by PCR: NEGATIVE
SARS Coronavirus 2 by RT PCR: NEGATIVE

## 2022-11-06 LAB — BASIC METABOLIC PANEL
Anion gap: 6 (ref 5–15)
BUN: 6 mg/dL — ABNORMAL LOW (ref 8–23)
CO2: 23 mmol/L (ref 22–32)
Calcium: 8.5 mg/dL — ABNORMAL LOW (ref 8.9–10.3)
Chloride: 102 mmol/L (ref 98–111)
Creatinine, Ser: 0.8 mg/dL (ref 0.61–1.24)
GFR, Estimated: >60 mL/min (ref >60)
Glucose, Bld: 121 mg/dL — ABNORMAL HIGH (ref 70–99)
Potassium: 3.4 mmol/L — ABNORMAL LOW (ref 3.5–5.1)
Sodium: 131 mmol/L — ABNORMAL LOW (ref 135–145)

## 2022-11-06 LAB — CBC
HCT: 37.9 % — ABNORMAL LOW (ref 39.0–52.0)
Hemoglobin: 12.8 g/dL — ABNORMAL LOW (ref 13.0–17.0)
MCH: 33.8 pg (ref 26.0–34.0)
MCHC: 33.8 g/dL (ref 30.0–36.0)
MCV: 100 fL (ref 80.0–100.0)
Platelets: 145 10*3/uL — ABNORMAL LOW (ref 150–400)
RBC: 3.79 MIL/uL — ABNORMAL LOW (ref 4.22–5.81)
RDW: 12.6 % (ref 11.5–15.5)
WBC: 6.1 10*3/uL (ref 4.0–10.5)
nRBC: 0 % (ref 0.0–0.2)

## 2022-11-06 MED ORDER — OXYCODONE HCL 5 MG PO TABS
5.0000 mg | ORAL_TABLET | Freq: Once | ORAL | Status: AC
  Administered 2022-11-06: 5 mg via ORAL
  Filled 2022-11-06: qty 1

## 2022-11-06 MED ORDER — IBUPROFEN 400 MG PO TABS
400.0000 mg | ORAL_TABLET | Freq: Once | ORAL | Status: DC
  Filled 2022-11-06: qty 1

## 2022-11-06 MED ORDER — LACTATED RINGERS IV BOLUS
1000.0000 mL | Freq: Once | INTRAVENOUS | Status: AC
  Administered 2022-11-06: 1000 mL via INTRAVENOUS

## 2022-11-06 NOTE — ED Triage Notes (Signed)
Pt to ED For generalized body aches started a couple days ago. Also reports chills. Pt reports "not having a good BM" for a month, states "I feel like I'm getting sepsis from my stool" Reports seen at [facility] a week ago for constipation, pt reports being "clogged up", however states last BM this am

## 2022-11-06 NOTE — ED Provider Notes (Signed)
[facility] Provider Note    Event Date/Time   First MD Initiated Contact with Patient 11/06/22 [id number]     (approximate)   History   Generalized Body Aches   HPI  Alexander Wilkins is a 62 y.o. male past medical history of cirrhosis, hep C, alcohol use disorder who presents with shoulder pain body aches and fever.  Patient is concerned that he has sepsis.  Says he had a colonoscopy in March since that time he said abnormal bowel movements.  He is currently taking mag citrate and is having loose stool.  He is scheduled to have a sigmoidoscopy with Dr. Timothy Lasso today.  He woke up today with right shoulder pain diffuse myalgias had temp of 99 he is concerned that he is developing sepsis related to the colonoscopy.  He is denying nausea vomiting abdominal pain.  Still eating and drinking.  No ongoing rectal bleeding.  He denies cough congestion or urinary symptoms.  No chest pain.  At admission from late March when patient presented with GI bleed.  EGD showed gastritis and duodenitis.  Did have a stricture that was not able to be passed by colonoscopy.  She also saw Dr. Vickii Chafe 3 days ago on 4/8.  Patient has a diverticular stricture.  [name] is for him to have a sigmoidoscopy and have a tattoo placed distal to the stricture, potential surgical planning.  Patient is currently on Cipro and Flagyl.  Pain at [facility] on 4/6 for abdominal pain CT showed diffuse thickening of the sigmoid colon and rectum suggestive of proctocolitis in the setting of diverticulosis.  He was placed on Cipro and Flagyl at that time.     Past Medical History:  Diagnosis Date   Arthritis    Chronic hepatitis C    Genotype Ia   Cirrhosis     Patient Active Problem List   Diagnosis Date Noted   Stricture of colon determined by endoscopy 10/11/2022   Chronic pain 10/11/2022   UGIB (upper gastrointestinal bleed) 10/10/2022   Alcohol abuse 10/10/2022   Acute blood loss anemia 10/10/2022   Other  cirrhosis of liver 12/02/2021   Elevated LFTs 12/02/2021   Colon cancer screening 10/09/2021   Chronic hepatitis C without hepatic coma 10/09/2021     Physical Exam  Triage Vital Signs: ED Triage Vitals  Enc Vitals Group     BP 11/06/22 0832 (!) 117/55     Pulse Rate 11/06/22 0832 83     Resp 11/06/22 0832 18     Temp 11/06/22 0832 97.9 F (36.6 C)     Temp Source 11/06/22 0832 Oral     SpO2 11/06/22 0832 97 %     Weight 11/06/22 0838 200 lb (90.7 kg)     Height 11/06/22 0838 6\' 2"  (1.88 m)     Head Circumference --      Peak Flow --      Pain Score 11/06/22 0838 10     Pain Loc --      Pain Edu? --      Excl. in GC? --     Most recent vital signs: Vitals:   11/06/22 0832  BP: (!) 117/55  Pulse: 83  Resp: 18  Temp: 97.9 F (36.6 C)  SpO2: 97%     General: Awake, no distress.  CV:  Good peripheral perfusion.  Resp:  Normal effort.  Abd:  No distention.  Abdomen is soft nontender throughout Neuro:  Awake, Alert, Oriented x 3  Other:  Patient has mild tenderness over the right anterior shoulder with some pain with abduction and flexion but good range of motion no erythema   ED Results / Procedures / Treatments  Labs (all labs ordered are listed, but only abnormal results are displayed) Labs Reviewed  CBC - Abnormal; Notable for the following components:      Result Value   RBC 3.79 (*)    Hemoglobin 12.8 (*)    HCT 37.9 (*)    Platelets 145 (*)    All other components within normal limits  BASIC METABOLIC PANEL - Abnormal; Notable for the following components:   Sodium 131 (*)    Potassium 3.4 (*)    Glucose, Bld 121 (*)    BUN 6 (*)    Calcium 8.5 (*)    All other components within normal limits  RESP PANEL BY RT-PCR (RSV, FLU A&B, COVID)  [id number]     EKG     RADIOLOGY    PROCEDURES:  Critical Care performed: No  Procedures    MEDICATIONS ORDERED IN ED: Medications  ibuprofen (ADVIL) tablet 400 mg (400 mg Oral Patient  Refused/Not Given 11/06/22 1010)  lactated ringers bolus 1,000 mL (1,000 mLs Intravenous New Bag/Given 11/06/22 1023)  oxyCODONE (Oxy IR/ROXICODONE) immediate release tablet 5 mg (5 mg Oral Given 11/06/22 1024)     IMPRESSION / MDM / ASSESSMENT AND PLAN / ED COURSE  I reviewed the triage vital signs and the nursing notes.                              Patient's presentation is most consistent with acute complicated illness / injury requiring diagnostic workup.  Differential diagnosis includes, but is not limited to, viral illness, sepsis secondary to diverticulitis, electrode abnormality, anemia  Patient is a 62 year old male with multiple chronic medical conditions recently diagnosed with a diverticular stricture who presents today with several symptoms.  He is concerned primarily about sepsis secondary to a recent colonoscopy that he had about a month ago because his bowel movements had not been normal since.  Was taking mag citrate and is having loose stool.  He is also undergoing a sigmoidoscopy today at 1 PM and took a bowel prep for this.  He had temperature 99 at home and woke up today with some myalgias including right shoulder pain neck pain and some bodyaches.  However he is not having abdominal pain vomiting or urinary symptoms no cough or other cardiopulmonary symptoms.  Patient's afebrile here with stable vital signs.  Screening labs are notable for a hemoglobin that is improved since his last admission, no leukocytosis mild hyponatremia sodium 131 and mild hypokalemia potassium 3.4.  Patient's exam is reassuring his abdomen is benign and I have low suspicion for complication of diverticulitis which he is currently being treated for with Cipro and Flagyl.  Lung sounds clear.  He is complaining of right shoulder pain does have some pain with range of motion but no erythema is able to range and I have low suspicion for infection related to the shoulder.  Did check for COVID and flu which is  pending at the time of discharge.  Patient was given oxycodone for pain and a bolus of fluid after which she felt improved.  I did encourage him to follow-up with Dr. Timothy Lasso for his sigmoidoscopy for planning of possible colectomy.  Otherwise my suspicion for acute infection  is low and I think patient can safely be discharged.       FINAL CLINICAL IMPRESSION(S) / ED DIAGNOSES   Final diagnoses:  Myalgia     Rx / DC Orders   ED Discharge Orders     None        Note:  This document was prepared using Dragon voice recognition software and may include unintentional dictation errors.   Georga HackingMcHugh, Thurza Kwiecinski Rose, MD 11/06/22 1116

## 2022-11-06 NOTE — Discharge Instructions (Addendum)
You can take Tylenol for your muscle aches.  Please keep your appointment with Dr. Timothy Lasso for your sigmoidoscopy.  Please need to take your antibiotics for your diverticulitis as prescribed.

## 2022-11-12 ENCOUNTER — Ambulatory Visit: Payer: 59 | Admitting: Surgery

## 2022-11-12 ENCOUNTER — Telehealth: Payer: Self-pay

## 2022-11-12 NOTE — Telephone Encounter (Signed)
Faxed medical records to [facility] at [phone].

## 2022-11-17 ENCOUNTER — Encounter: Admission: RE | Payer: Self-pay | Source: Home / Self Care

## 2022-11-17 ENCOUNTER — Ambulatory Visit: Admission: RE | Admit: 2022-11-17 | Payer: 59 | Source: Home / Self Care | Admitting: Gastroenterology

## 2022-11-17 SURGERY — SIGMOIDOSCOPY, FLEXIBLE
Anesthesia: General

## 2022-12-17 ENCOUNTER — Ambulatory Visit: Payer: 59 | Admitting: Surgery

## 2023-02-08 IMAGING — US US ABDOMEN COMPLETE W/ ELASTOGRAPHY
1 series · 12 of 25 positions shown · non-contrast
Comparison: None Available.

CLINICAL DATA: Hepatitis C

EXAM:
ULTRASOUND ABDOMEN
ULTRASOUND HEPATIC ELASTOGRAPHY
TECHNIQUE: Sonography of the upper abdomen was performed. In addition,
ultrasound elastography evaluation of the liver was performed. A
region of interest was placed within the right lobe of the liver.
Following application of a compressive sonographic pulse, tissue
compressibility was assessed. Multiple assessments were performed at
the selected site. Median tissue compressibility was determined.
Previously, hepatic stiffness was assessed by shear wave velocity.
Based on recently published Society of Radiologists in Ultrasound
consensus article, reporting is now recommended to be performed in
the SI units of pressure (kiloPascals) representing hepatic
stiffness/elasticity. The obtained result is compared to the
published reference standards. (cACLD = compensated Advanced Chronic
Liver Disease)

[Series 1: us abdomen complete w/elastography · 12 of 129 slices shown]
[im 6/129]
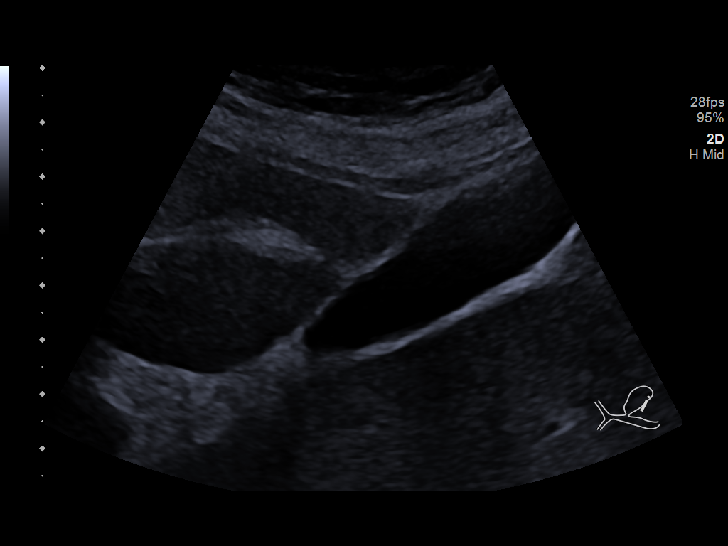
[im 17/129]
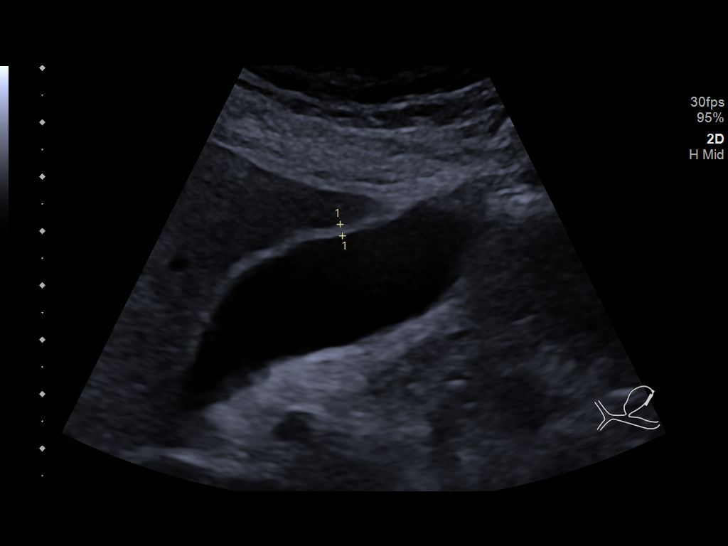
[im 27/129]
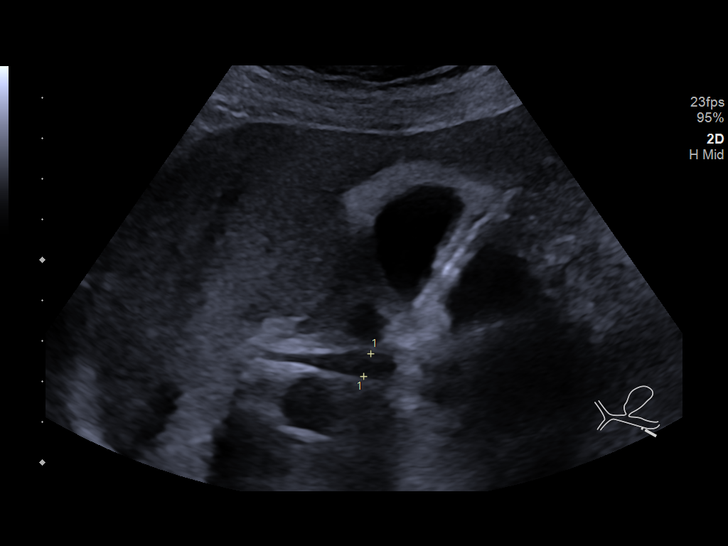
[im 38/129]
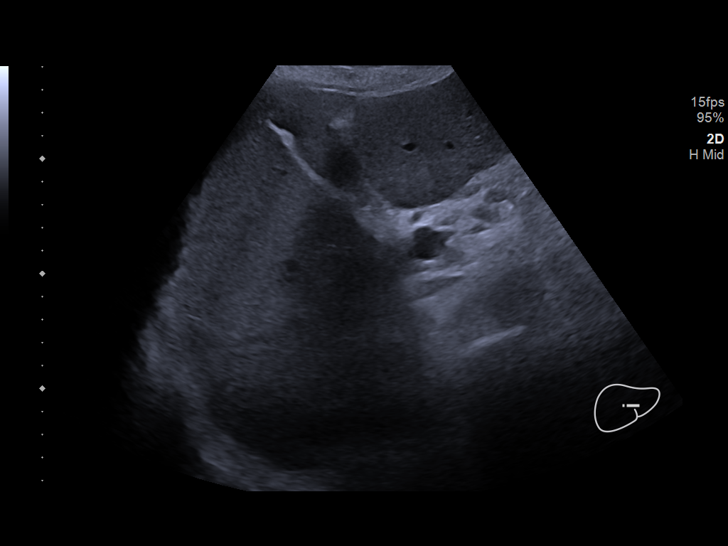
[im 49/129]
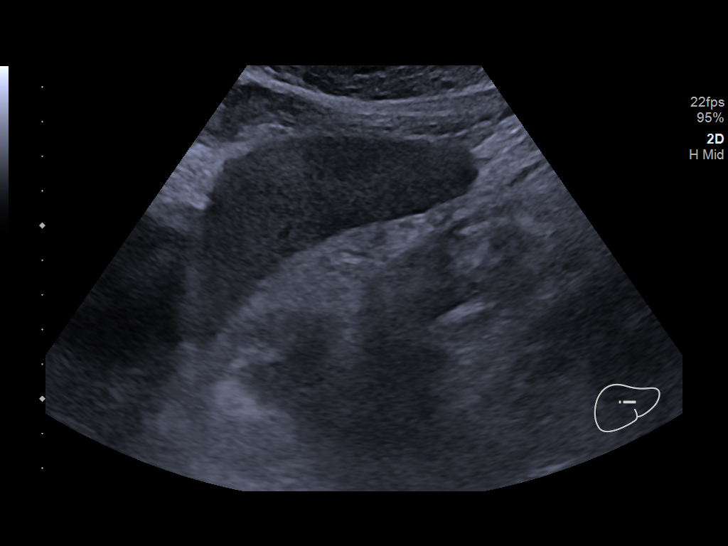
[im 59/129]
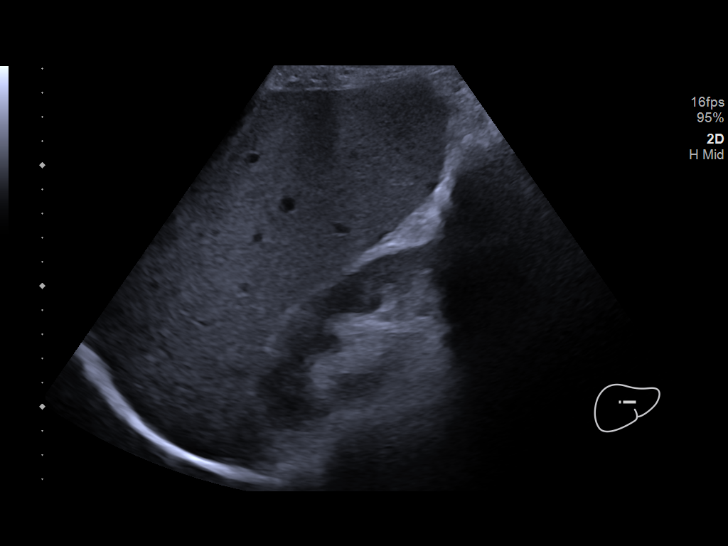
[im 70/129]
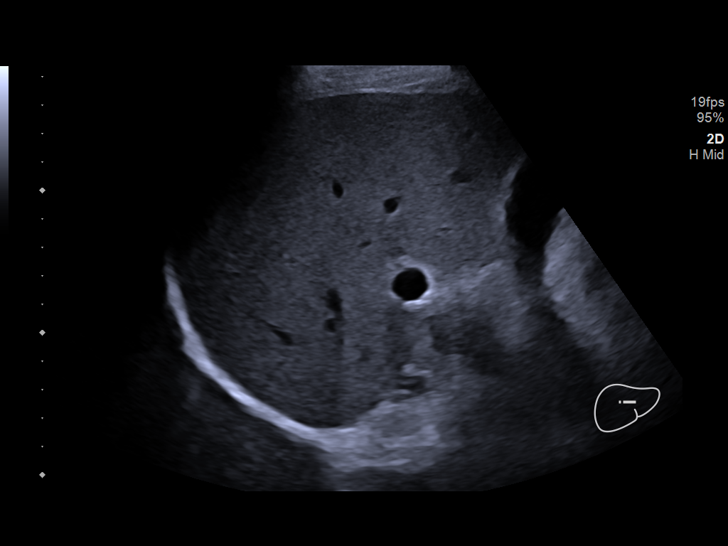
[im 81/129]
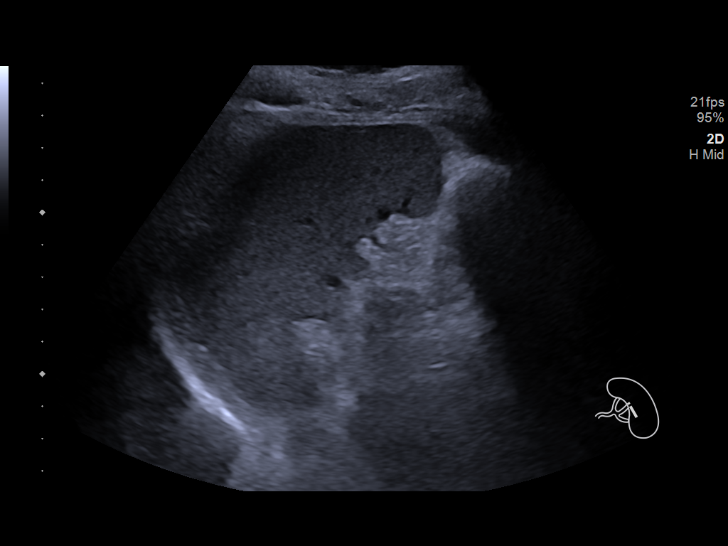
[im 91/129]
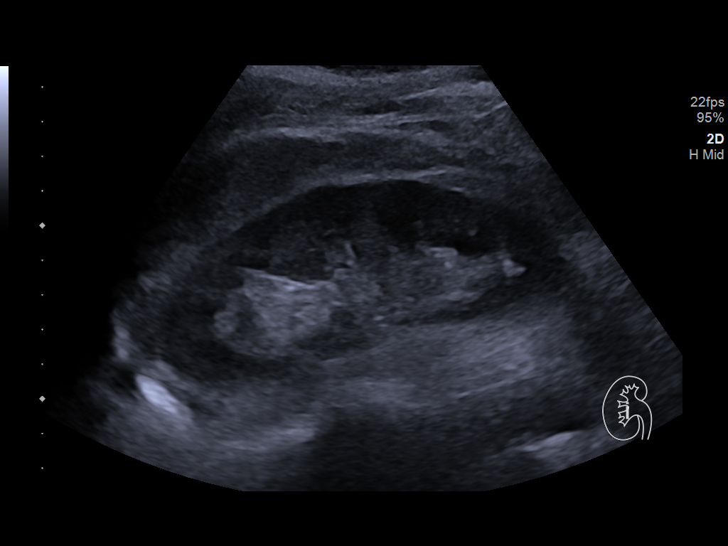
[im 102/129]
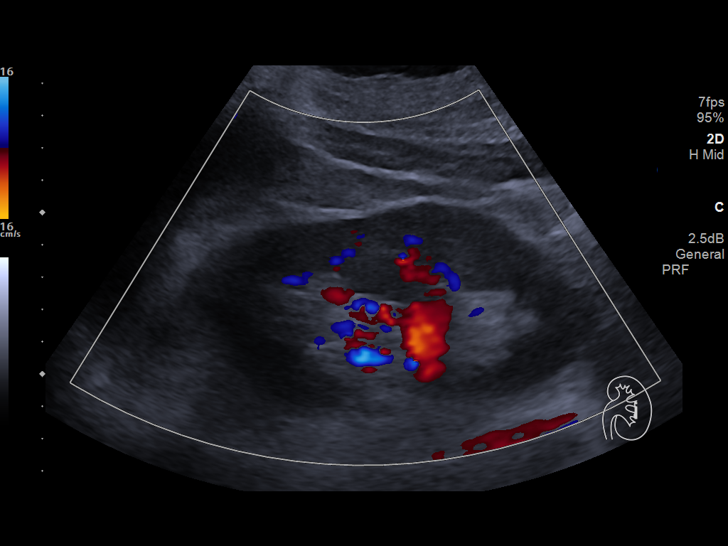
[im 113/129]
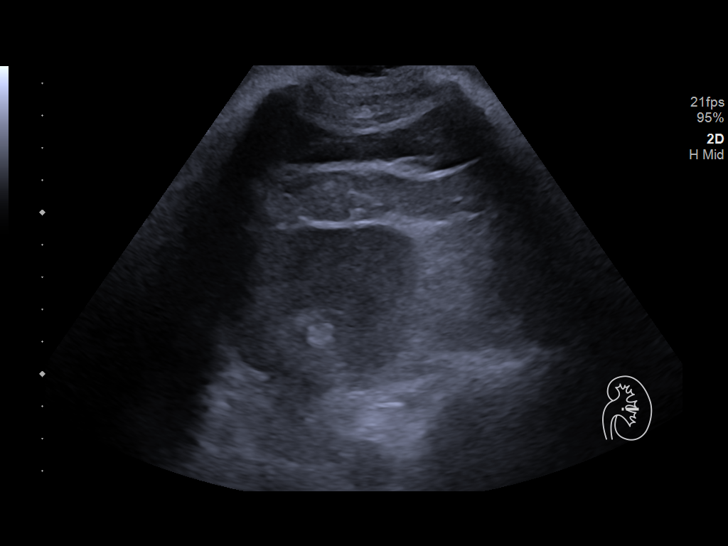
[im 123/129]
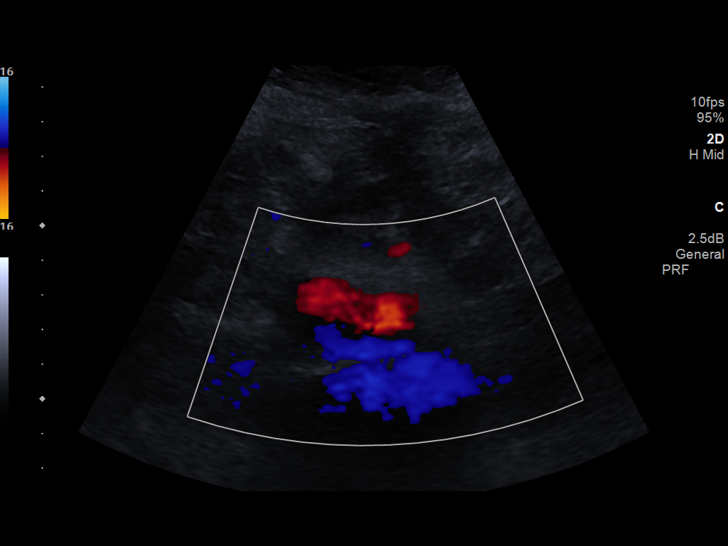

[12 of 25 positions shown; findings below may reference images not displayed]

FINDINGS: ULTRASOUND ABDOMEN

Gallbladder: No gallstones or wall thickening visualized. No
sonographic Murphy sign noted by sonographer.

Common bile duct: Diameter: 5.9 mm

Liver: No focal lesion identified. Nodular liver contour with
increased parenchymal echogenicity. Portal vein is patent on color
Doppler imaging with normal direction of blood flow towards the
liver.

IVC: No abnormality visualized.

Pancreas: Visualized portion unremarkable.

Spleen: Size and appearance within normal limits.

Right Kidney: Length: 12.2. Echogenicity within normal limits. No
mass or hydronephrosis visualized.

Left Kidney: Length: 12.0. Echogenicity within normal limits. No
mass or hydronephrosis visualized.

Abdominal aorta: No aneurysm visualized.

Other findings: None.

ULTRASOUND HEPATIC ELASTOGRAPHY

Device: Siemens Helix VTQ

Patient position: Oblique

Transducer [id number]

Number of measurements: 13

Hepatic segment:  8

Median kPa:

IQR:

IQR/Median kPa ratio:

Data quality:  Good

Diagnostic category: < or = 9 kPa: in the absence of other known
clinical signs, rules out cACLD

The use of hepatic elastography is applicable to patients with viral
hepatitis and non-alcoholic fatty liver disease. At this time, there
is insufficient data for the referenced cut-off values and use in
other causes of liver disease, including alcoholic liver disease.
Patients, however, may be assessed by elastography and serve as
their own reference standard/baseline.

In patients with non-alcoholic liver disease, the values suggesting
compensated advanced chronic liver disease (cACLD) may be lower, and
patients may need additional testing with elasticity results of [DATE]
kPa.

Please note that abnormal hepatic elasticity and shear wave
velocities may also be identified in clinical settings other than
with hepatic fibrosis, such as: acute hepatitis, elevated right
heart and central venous pressures including use of beta blockers,
Greeteliis disease (Mnhl), infiltrative processes such as
mastocytosis/amyloidosis/infiltrative tumor/lymphoma, extrahepatic
cholestasis, with hyperemia in the post-prandial state, and with
liver transplantation. Correlation with patient history, laboratory
data, and clinical condition recommended.

Diagnostic Categories:

< or =5 kPa: high probability of being normal

< or =9 kPa: in the absence of other known clinical signs, rules [DATE] kPa and ?13 kPa: suggestive of cACLD, but needs further testing

>13 kPa: highly suggestive of cACLD

> or =17 kPa: highly suggestive of cACLD with an increased
probability of clinically significant portal hypertension
IMPRESSION: ULTRASOUND ABDOMEN:

1. Hepatic steatosis.
2. Nodular liver contour, findings is suggestive of cirrhosis.

ULTRASOUND HEPATIC ELASTOGRAPHY:

Median kPa:

Diagnostic category: < or = 9 kPa: in the absence of other known
clinical signs, rules out cACLD

## 2023-09-30 ENCOUNTER — Other Ambulatory Visit: Payer: Self-pay | Admitting: Urology

## 2023-09-30 DIAGNOSIS — N5201 Erectile dysfunction due to arterial insufficiency: Secondary | ICD-10-CM
